# Patient Record
Sex: Female | Born: 1950
Health system: Southern US, Community
[De-identification: ages and names within clinical notes are randomized; demographics above are authoritative.]

## PROBLEM LIST (undated history)

## (undated) DIAGNOSIS — I10 Essential (primary) hypertension: Secondary | ICD-10-CM

## (undated) DIAGNOSIS — R7303 Prediabetes: Secondary | ICD-10-CM

## (undated) DIAGNOSIS — M1990 Unspecified osteoarthritis, unspecified site: Secondary | ICD-10-CM

## (undated) DIAGNOSIS — J45909 Unspecified asthma, uncomplicated: Secondary | ICD-10-CM

## (undated) DIAGNOSIS — K5120 Ulcerative (chronic) proctitis without complications: Secondary | ICD-10-CM

## (undated) DIAGNOSIS — R030 Elevated blood-pressure reading, without diagnosis of hypertension: Secondary | ICD-10-CM

## (undated) HISTORY — DX: Elevated blood-pressure reading, without diagnosis of hypertension: R03.0

## (undated) HISTORY — PX: COLONOSCOPY: SHX174

## (undated) HISTORY — DX: Prediabetes: R73.03

## (undated) HISTORY — PX: CHOLECYSTECTOMY: SHX55

## (undated) HISTORY — PX: ABDOMINAL HYSTERECTOMY: SHX81

---

## 1982-07-31 HISTORY — PX: GALLBLADDER SURGERY: SHX652

## 1982-07-31 HISTORY — PX: COLON SURGERY: SHX602

## 1983-08-01 HISTORY — PX: APPENDECTOMY: SHX54

## 1983-08-01 HISTORY — PX: BREAST EXCISIONAL BIOPSY: SUR124

## 1983-08-01 HISTORY — PX: BREAST BIOPSY: SHX20

## 1983-08-01 HISTORY — PX: VESICOVAGINAL FISTULA CLOSURE W/ TAH: SUR271

## 1993-07-31 HISTORY — PX: BREAST SURGERY: SHX581

## 2007-08-12 DIAGNOSIS — J302 Other seasonal allergic rhinitis: Secondary | ICD-10-CM

## 2007-08-12 DIAGNOSIS — J3089 Other allergic rhinitis: Secondary | ICD-10-CM

## 2007-08-12 DIAGNOSIS — J4520 Mild intermittent asthma, uncomplicated: Secondary | ICD-10-CM | POA: Insufficient documentation

## 2007-08-13 ENCOUNTER — Encounter: Payer: Self-pay | Admitting: Internal Medicine

## 2007-08-27 ENCOUNTER — Ambulatory Visit: Payer: Self-pay | Admitting: Internal Medicine

## 2008-08-03 ENCOUNTER — Telehealth (INDEPENDENT_AMBULATORY_CARE_PROVIDER_SITE_OTHER): Payer: Self-pay | Admitting: *Deleted

## 2008-08-03 ENCOUNTER — Encounter (INDEPENDENT_AMBULATORY_CARE_PROVIDER_SITE_OTHER): Payer: Self-pay | Admitting: *Deleted

## 2008-08-12 ENCOUNTER — Encounter (INDEPENDENT_AMBULATORY_CARE_PROVIDER_SITE_OTHER): Payer: Self-pay | Admitting: *Deleted

## 2008-08-24 ENCOUNTER — Ambulatory Visit: Payer: Self-pay | Admitting: Internal Medicine

## 2010-04-11 ENCOUNTER — Ambulatory Visit: Payer: Self-pay | Admitting: Internal Medicine

## 2010-08-30 NOTE — Assessment & Plan Note (Signed)
Summary: rov/ mbw   Primary Provider/Referring Provider:  [place]  CC:  Follow up visit-asthma and allergies; recent chest cold-used Mucinex and helped.Marland Kitchen  History of Present Illness: History of Present Illness: Current Problems:  ALLERGIC RHINITIS (ICD-477.9) ASTHMA (ICD-493.90)  09/04/2007-  [place] [facility] patient with seasonal Spring and Fall complaints, mostly just chest tight and wheeze. Never needed ER. No maintenance inhaler. has an HFA inhaler that clogs up. Typically uses rescue inhaler 1-2 times/day for several days, then weeks or months with no need for med. No WASO. Proair. Never needed maintenance therapy. Never skin tested or on shots.  08/24/08- Asthma, rhinitis Last month called [place] for Rx for acute infection, but that resolved. No flu shot- delclines H1N1. Proair has been sufficient. Usually only needs rescue inhaler during extremes of temperature- usually not needed at all. Charting updated.  April 11, 2010- Asthma, rhinitis 60 yo never smoker coming for 1 year f/u. Getting over a cold- took Mucinex. Has been using her rescue inhaler more with this. Not bad, but aware of the wheeze. Cough productive, sputum is turning white again. No sinus trouble. Overall health good.     Asthma History    Initial Asthma Severity Rating:    Age range: 12+ years    Symptoms: 0-2 days/week    Nighttime Awakenings: 0-2/month    Interferes w/ normal activity: no limitations    SABA use (not for EIB): 0-2 days/week    Asthma Severity Assessment: Intermittent    Preventive Screening-Counseling & Management  Alcohol-Tobacco     Smoking Status: never  Current Medications (verified): 1)  Ventolin Hfa 108 (90 Base) Mcg/act Aers (Albuterol Sulfate) .... 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  Not married  Review of Systems      See HPI       The patient complains of productive cough and change in color of mucus.  The patient denies shortness of breath with activity, shortness of breath at rest, non-productive cough, coughing up blood, chest pain, irregular heartbeats, acid heartburn, indigestion, weight change, abdominal pain, difficulty swallowing, sore throat, tooth/dental problems, headaches, nasal congestion/difficulty breathing through nose, sneezing, itching, ear ache, hand/feet swelling, rash, and fever.    Vital Signs:  Patient profile:   60 year old female Weight:      134.13 pounds O2 Sat:      98 % on Room air Pulse rate:   74 / minute BP sitting:   142 / 88  (left arm) Cuff size:   regular  Vitals Entered By: Reynaldo Minium CMA (April 11, 2010 4:48 PM)  O2 Flow:  Room air CC: Follow up visit-asthma and allergies; recent chest cold-used Mucinex and helped.   Physical Exam  Additional Exam:  General: A/Ox3; pleasant and cooperative, NAD, trim SKIN: no rash, lesions NODES: no lymphadenopathy HEENT: [place]/AT, EOM- WNL, Conjuctivae- clear, PERRLA, TM-WNL, Nose- clear, Throat-red/ no exudate or drainage, voice normal  NECK: Supple w/ fair ROM, JVD- none, normal carotid impulses w/o bruits Thyroid- normal to palpation CHEST: Clear to P&A, quiet  chest, no cough or wheeze HEART: RRR, no m/g/r heard ABDOMEN: Soft and nl; nml bowel sounds; no organomegaly or masses  noted, trim VQQ:VZDG, nl pulses, no edema  NEURO: Grossly intact to observation      Impression & Recommendations:  Problem # 1:  ALLERGIC RHINITIS (ICD-477.9)  Minor and controlled now.  Problem # 2:  ASTHMA (ICD-493.90) Recent acute exacerbation related to a viral pattern and now resolving with normal course. We need to refill her rescue inhaler but I don't think she will need more than that. Fluids and rest advised. Flu vax deferred till feeling better.  Other Orders: Est. Patient Level III (38756)  Patient Instructions: 1)  Please schedule a follow-up appointment in 1 year. 2)  Refill script for Ventolin rescue inhaler 3)  Continue fluids rest and comfort measures. If this viral pattern flare doesn't go on and clear, let me know. Prescriptions: VENTOLIN HFA 108 (90 BASE) MCG/ACT AERS (ALBUTEROL SULFATE) 2 puffs four times a day as needed  #1 x prn   Entered and Authorized by:   Waymon Budge MD   Signed by:   Waymon Budge MD on 04/11/2010   Method used:   Print then Give to Patient   RxID:   [id number]

## 2010-12-06 ENCOUNTER — Emergency Department (HOSPITAL_COMMUNITY)
Admission: EM | Admit: 2010-12-06 | Discharge: 2010-12-07 | Disposition: A | Discharge status: Home / Self Care | Payer: 59 | Attending: Emergency Medicine | Admitting: Emergency Medicine

## 2010-12-07 ENCOUNTER — Emergency Department (HOSPITAL_COMMUNITY)
Admission: EM | Admit: 2010-12-07 | Discharge: 2010-12-07 | Disposition: A | Discharge status: Home / Self Care | Payer: 59 | Attending: Emergency Medicine | Admitting: Emergency Medicine

## 2010-12-07 DIAGNOSIS — IMO0001 Reserved for inherently not codable concepts without codable children: Secondary | ICD-10-CM | POA: Insufficient documentation

## 2010-12-07 DIAGNOSIS — S61209A Unspecified open wound of unspecified finger without damage to nail, initial encounter: Secondary | ICD-10-CM | POA: Insufficient documentation

## 2010-12-07 DIAGNOSIS — Z203 Contact with and (suspected) exposure to rabies: Secondary | ICD-10-CM | POA: Insufficient documentation

## 2010-12-07 DIAGNOSIS — J45909 Unspecified asthma, uncomplicated: Secondary | ICD-10-CM | POA: Insufficient documentation

## 2010-12-07 DIAGNOSIS — Z23 Encounter for immunization: Secondary | ICD-10-CM | POA: Insufficient documentation

## 2010-12-11 ENCOUNTER — Inpatient Hospital Stay (INDEPENDENT_AMBULATORY_CARE_PROVIDER_SITE_OTHER)
Admission: RE | Admit: 2010-12-11 | Discharge: 2010-12-11 | Disposition: A | Discharge status: Home / Self Care | Payer: 59 | Source: Ambulatory Visit | Attending: Family Medicine | Admitting: Family Medicine

## 2010-12-11 DIAGNOSIS — Z23 Encounter for immunization: Secondary | ICD-10-CM

## 2010-12-15 ENCOUNTER — Inpatient Hospital Stay (INDEPENDENT_AMBULATORY_CARE_PROVIDER_SITE_OTHER)
Admission: RE | Admit: 2010-12-15 | Discharge: 2010-12-15 | Disposition: A | Discharge status: Home / Self Care | Payer: 59 | Source: Ambulatory Visit | Attending: Family Medicine | Admitting: Family Medicine

## 2010-12-15 DIAGNOSIS — Z23 Encounter for immunization: Secondary | ICD-10-CM

## 2010-12-21 ENCOUNTER — Inpatient Hospital Stay (INDEPENDENT_AMBULATORY_CARE_PROVIDER_SITE_OTHER)
Admission: RE | Admit: 2010-12-21 | Discharge: 2010-12-21 | Disposition: A | Discharge status: Home / Self Care | Payer: 59 | Source: Ambulatory Visit

## 2010-12-21 DIAGNOSIS — Z23 Encounter for immunization: Secondary | ICD-10-CM

## 2011-06-05 ENCOUNTER — Other Ambulatory Visit: Payer: Self-pay | Admitting: Internal Medicine

## 2012-03-31 ENCOUNTER — Encounter (HOSPITAL_COMMUNITY): Payer: Self-pay | Admitting: *Deleted

## 2012-03-31 ENCOUNTER — Emergency Department (HOSPITAL_COMMUNITY)
Admission: EM | Admit: 2012-03-31 | Discharge: 2012-03-31 | Disposition: A | Discharge status: Home / Self Care | Payer: 59 | Attending: Emergency Medicine | Admitting: Emergency Medicine

## 2012-03-31 DIAGNOSIS — W540XXA Bitten by dog, initial encounter: Secondary | ICD-10-CM | POA: Insufficient documentation

## 2012-03-31 DIAGNOSIS — S51809A Unspecified open wound of unspecified forearm, initial encounter: Secondary | ICD-10-CM | POA: Insufficient documentation

## 2012-03-31 DIAGNOSIS — Y9389 Activity, other specified: Secondary | ICD-10-CM | POA: Insufficient documentation

## 2012-03-31 DIAGNOSIS — Y998 Other external cause status: Secondary | ICD-10-CM | POA: Insufficient documentation

## 2012-03-31 DIAGNOSIS — J45909 Unspecified asthma, uncomplicated: Secondary | ICD-10-CM | POA: Insufficient documentation

## 2012-03-31 HISTORY — DX: Unspecified asthma, uncomplicated: J45.909

## 2012-03-31 MED ORDER — AMOXICILLIN-POT CLAVULANATE 875-125 MG PO TABS: 1.0000 | ORAL_TABLET | Freq: Two times a day (BID) | ORAL | Status: AC

## 2012-03-31 NOTE — ED Notes (Signed)
Pt bite by family dog, dog updated on shots and vaccine. Pt has two lacerations to right wrist. One on the posterior wrist that is like skin tear and the other a puncture on the anterior wrist. Pt able to wiggle fingers, sensation intact and cap refill less than 3.

## 2012-03-31 NOTE — ED Provider Notes (Signed)
History     CSN: [id number]  Arrival date & time 03/31/12  0865   First MD Initiated Contact with Patient 03/31/12 2108      Chief Complaint  Patient presents with  . Animal Bite   HPI  She provided by the patient. Patient is a 61 year old female with history of asthma who presents with injuries from dog bite. Patient states that she was taking between 2 of her dogs while they're fighting. She reports she has a Research scientist (medical) and terrier. While trying to pull them apart the terrier bit her on her right forearm and wrist area. Dogs are current on all shots.  Pt has had recent tetanus within past 5 years.  Pt sustained lacerations with bleeding.  Bleeding was controlled.  She denies any numbness or weakness in hand or wirst.  No decreased ROM.      Past Medical History  Diagnosis Date  . Asthma     History reviewed. No pertinent past surgical history.  History reviewed. No pertinent family history.  History  Substance Use Topics  . Smoking status: Never Smoker   . Smokeless tobacco: Not on file  . Alcohol Use: No    OB History    Grav Para Term Preterm Abortions TAB SAB Ect Mult Living                  Review of Systems  Skin:       Dog bite to rt wrist  Neurological: Negative for weakness and numbness.    Allergies  Review of patient's allergies indicates no known allergies.  Home Medications   Current Outpatient Rx  Name Route Sig Dispense Refill  . ALBUTEROL SULFATE HFA 108 (90 BASE) MCG/ACT IN AERS Inhalation Inhale 2 puffs into the lungs every 6 (six) hours as needed. For shortness of breath      BP 177/79  Pulse 93  Temp 97.6 F (36.4 C) (Oral)  Resp 16  SpO2 100%  Physical Exam  Nursing note and vitals reviewed. Constitutional: She is oriented to person, place, and time. She appears well-developed and well-nourished. No distress.  HENT:  Head: Normocephalic.  Cardiovascular: Normal rate and regular rhythm.   Pulmonary/Chest: Effort normal and  breath sounds normal.  Abdominal: Soft.  Musculoskeletal:       Arms:      Irregular laceration to rt wrist and forearm area.  Normal ROM in wrist and fingers.  Normal grip strength.  Normal sensations in fingers and normal cap refill < 2 sec.  No deep structure or tendon involvement during exploration and through full ROM.   Neurological: She is alert and oriented to person, place, and time.  Skin: Skin is warm and dry.  Psychiatric: She has a normal mood and affect. Her behavior is normal.    ED Course  Procedures   LACERATION REPAIR Performed by: Angus Seller Authorized by: Angus Seller Consent: Verbal consent obtained. Risks and benefits: risks, benefits and alternatives were discussed Consent given by: patient Patient identity confirmed: provided demographic data Prepped and Draped in normal sterile fashion Wound explored  Laceration Location: Right forearm  Laceration Length: 4cm  No Foreign Bodies seen or palpated  Anesthesia: local infiltration  Local anesthetic: lidocaine 2% with epinephrine  Anesthetic total: 4 ml  Irrigation method: syringe Amount of cleaning: Extensive  Skin closure: Loose closure with 3-0 Prolene, and steri-strips.  Number of sutures: 1  Technique: Simple interrupted.   Patient tolerance: Patient tolerated the procedure well with  no immediate complications.   1. Dog bite of forearm       MDM  Patient seen and evaluated. Wounds were heavily irrigated and loosely closed. 1 stitch to pull the wound loosely together Steri-Strips also applied       Angus Seller, [place] 04/01/12 [id number]

## 2012-03-31 NOTE — ED Notes (Signed)
Pt has 2 lacs to right wrist. Bleeding controlled. Cap refill <3sec, sensation continues to be intact.

## 2012-03-31 NOTE — ED Notes (Signed)
Pt d/c home in NAD. Pt voiced understanding of d/c instructions and follow up care.  

## 2012-04-01 NOTE — ED Provider Notes (Signed)
Medical screening examination/treatment/procedure(s) were performed by non-physician practitioner and as supervising physician I was immediately available for consultation/collaboration.   Joya Gaskins, MD 04/01/12 2351

## 2012-08-26 ENCOUNTER — Telehealth: Payer: Self-pay | Admitting: Internal Medicine

## 2012-08-26 MED ORDER — ALBUTEROL SULFATE HFA 108 (90 BASE) MCG/ACT IN AERS: 2.0000 | INHALATION_SPRAY | Freq: Four times a day (QID) | RESPIRATORY_TRACT | Status: DC | PRN

## 2012-08-26 NOTE — Telephone Encounter (Signed)
Called and spoke with pt She states that she is needing ventolin refilled She was last seen in 2011 I have scheduled her to see CDY to re establish on 09/17/12 Rx refilled x 1 only

## 2012-08-27 ENCOUNTER — Other Ambulatory Visit: Payer: Self-pay | Admitting: Internal Medicine

## 2012-09-17 ENCOUNTER — Encounter: Payer: Self-pay | Admitting: Internal Medicine

## 2012-09-17 ENCOUNTER — Ambulatory Visit (INDEPENDENT_AMBULATORY_CARE_PROVIDER_SITE_OTHER): Payer: 59 | Admitting: Internal Medicine

## 2012-09-17 ENCOUNTER — Institutional Professional Consult (permissible substitution): Payer: 59 | Admitting: Internal Medicine

## 2012-09-17 VITALS — BP 158/68 | HR 82 | Ht 64.0 in | Wt 138.8 lb

## 2012-09-17 DIAGNOSIS — J309 Allergic rhinitis, unspecified: Secondary | ICD-10-CM

## 2012-09-17 DIAGNOSIS — J4520 Mild intermittent asthma, uncomplicated: Secondary | ICD-10-CM

## 2012-09-17 DIAGNOSIS — J45909 Unspecified asthma, uncomplicated: Secondary | ICD-10-CM

## 2012-09-17 MED ORDER — ALBUTEROL SULFATE HFA 108 (90 BASE) MCG/ACT IN AERS: 2.0000 | INHALATION_SPRAY | Freq: Four times a day (QID) | RESPIRATORY_TRACT | Status: DC | PRN

## 2012-09-17 NOTE — Assessment & Plan Note (Signed)
Her triggers are nonspecific temperature irritants, seasonal changes and sent her. From her description, a rescue inhaler for occasional use as appropriate. I don't hear her describing need for a maintenance controller at this time

## 2012-09-17 NOTE — Progress Notes (Signed)
09/17/12- 61 yoF never smoker-Former patient-last seen 2011; asthma and allergies She wishes to establish right now, doing well and mainly just needing access to medication refills LOV 04/11/10. Never allergy skin tested. She has not needed her maintenance controller, just occasional rescue inhaler. Triggers include hot and cold weather extremes, more important than pollen. She is living in a house with no basement, central air conditioning, wall-to-wall carpet, no mold and no smokers. She works in a terrible job with no important exposure recognized.  Prior to Admission medications   Medication Sig Start Date End Date Taking? Authorizing Provider  albuterol (PROVENTIL HFA;VENTOLIN HFA) 108 (90 BASE) MCG/ACT inhaler Inhale 2 puffs into the lungs every 6 (six) hours as needed for wheezing or shortness of breath. For shortness of breath 09/17/12 09/17/13 Yes Waymon Budge, MD  alendronate (FOSAMAX) 70 MG tablet Take 70 mg by mouth every 7 (seven) days. Take with a full glass of water on an empty stomach.   Yes Historical Provider, MD  calcium citrate-vitamin D (CITRACAL+D) 315-200 MG-UNIT per tablet Take 1 tablet by mouth daily.   Yes Historical Provider, MD  ergocalciferol (VITAMIN D2) 50000 UNITS capsule Take 50,000 Units by mouth once a week.   Yes Historical Provider, MD   Past Medical History  Diagnosis Date  . Asthma    Past Surgical History  Procedure Laterality Date  . Gallbladder surgery  1984  . Colon surgery  1984  . Vesicovaginal fistula closure w/ tah  1985  . Breast surgery  1995  . Appendectomy  1985   Family History  Problem Relation Age of Onset  . Cancer Father     abdominal   History   Social History  . Marital Status: Legally Separated    Spouse Name: N/A    Number of Children: 1  . Years of Education: N/A   Occupational History  . clerical    Social History Main Topics  . Smoking status: Never Smoker   . Smokeless tobacco: Not on file  . Alcohol Use: No   . Drug Use: No  . Sexually Active:    Other Topics Concern  . Not on file   Social History Narrative  . No narrative on file   ROS-see HPI Constitutional:   No-   weight loss, night sweats, fevers, chills, fatigue, lassitude. HEENT:   No-  headaches, difficulty swallowing, tooth/dental problems, sore throat,       No-  sneezing, itching, ear ache, nasal congestion, post nasal drip,  CV:  No-   chest pain, orthopnea, PND, swelling in lower extremities, anasarca,                                  dizziness, palpitations Resp: No-   shortness of breath with exertion or at rest.              No-   productive cough,  No non-productive cough,  No- coughing up of blood.              No-   change in color of mucus.  +Occasional wheezing.   Skin: No-   rash or lesions. GI:  No-   heartburn, indigestion, abdominal pain, nausea, vomiting, diarrhea,                 change in bowel habits, loss of appetite GU: No-   dysuria, change in color of urine, no urgency  or frequency.  No- flank pain. MS:  No-   joint pain or swelling.  No- decreased range of motion.  No- back pain. Neuro-     nothing unusual Psych:  No- change in mood or affect. No depression or anxiety.  No memory loss.  OBJ- Physical Exam General- Alert, Oriented, Affect-appropriate, Distress- none acute Skin- rash-none, lesions- none, excoriation- none Lymphadenopathy- none Head- atraumatic            Eyes- Gross vision intact, PERRLA, conjunctivae and secretions clear            Ears- Hearing, canals-normal            Nose- Clear, no-Septal dev, mucus, polyps, erosion, perforation             Throat- Mallampati II-III , mucosa clear , drainage- none, tonsils- atrophic Neck- flexible , trachea midline, no stridor , thyroid nl, carotid no bruit Chest - symmetrical excursion , unlabored           Heart/CV- RRR , no murmur , no gallop  , no rub, nl s1 s2                           - JVD- none , edema- none, stasis changes- none,  varices- none           Lung- clear to P&A, wheeze- none, cough- none , dullness-none, rub- none           Chest wall-  Abd- tender-no, distended-no, bowel sounds-present, HSM- no Br/ Gen/ Rectal- Not done, not indicated Extrem- cyanosis- none, clubbing, none, atrophy- none, strength- nl Neuro- grossly intact to observation

## 2012-09-17 NOTE — Patient Instructions (Addendum)
Script is active in computer to refill your rescue inhaler- Please call for refill as needed  Let us know if we can help.

## 2012-09-17 NOTE — Assessment & Plan Note (Signed)
We will watch to see if she needs more than just an over-the-counter antihistamine

## 2012-11-03 ENCOUNTER — Other Ambulatory Visit: Payer: Self-pay | Admitting: Internal Medicine

## 2013-09-17 ENCOUNTER — Ambulatory Visit: Payer: Self-pay | Admitting: Internal Medicine

## 2013-12-16 ENCOUNTER — Other Ambulatory Visit: Payer: Self-pay | Admitting: Internal Medicine

## 2013-12-17 NOTE — Telephone Encounter (Signed)
Refill request for Ventolin HFA.  Last ov w/ CDY was 08/2012 with recs to follow up in 1 year.  1 additional refill given with note to pharmacy that pt is overdue for appt.

## 2014-08-13 ENCOUNTER — Encounter: Payer: Self-pay | Admitting: Internal Medicine

## 2014-08-13 ENCOUNTER — Ambulatory Visit (INDEPENDENT_AMBULATORY_CARE_PROVIDER_SITE_OTHER): Payer: BLUE CROSS/BLUE SHIELD | Admitting: Internal Medicine

## 2014-08-13 VITALS — BP 124/80 | HR 94 | Ht 64.0 in | Wt 159.4 lb

## 2014-08-13 DIAGNOSIS — J4520 Mild intermittent asthma, uncomplicated: Secondary | ICD-10-CM

## 2014-08-13 DIAGNOSIS — J45901 Unspecified asthma with (acute) exacerbation: Secondary | ICD-10-CM

## 2014-08-13 MED ORDER — AZITHROMYCIN 250 MG PO TABS: ORAL_TABLET | ORAL | Status: DC

## 2014-08-13 MED ORDER — LEVALBUTEROL HCL 0.63 MG/3ML IN NEBU
0.6300 mg | INHALATION_SOLUTION | Freq: Once | RESPIRATORY_TRACT | Status: AC
  Administered 2014-08-13: 0.63 mg via RESPIRATORY_TRACT

## 2014-08-13 MED ORDER — ALBUTEROL SULFATE HFA 108 (90 BASE) MCG/ACT IN AERS: INHALATION_SPRAY | RESPIRATORY_TRACT | Status: DC

## 2014-08-13 MED ORDER — METHYLPREDNISOLONE ACETATE 80 MG/ML IJ SUSP
80.0000 mg | Freq: Once | INTRAMUSCULAR | Status: AC
  Administered 2014-08-13: 80 mg via INTRAMUSCULAR

## 2014-08-13 NOTE — Patient Instructions (Signed)
Script sent for Z pak  And for albuterol rescue inhaler  Neb xop 0.63  Depo 80  Please call if you don't feel you are doing ok in a few days  Call when you are feeling better, to come by and get a flu shot

## 2014-08-13 NOTE — Progress Notes (Signed)
09/17/12- 61 yoF never smoker-Former patient-last seen 2011; asthma and allergies She wishes to establish right now, doing well and mainly just needing access to medication refills LOV 04/11/10. Never allergy skin tested. She has not needed her maintenance controller, just occasional rescue inhaler. Triggers include hot and cold weather extremes, more important than pollen. She is living in a house with no basement, central air conditioning, wall-to-wall carpet, no mold and no smokers. She works in a terrible job with no important exposure recognized.  08/13/14- 89 yoF never smoker-Former patient-last seen 2011; asthma and allergies FOLLOWS ONG:EXBMW congestion-has not been treated for this. Wheezing and SOB as well. Had not gotten around to flu vaccine but we decided to defer it for this visit because she is acutely ill with a cold caught from her children. No definite fever. Sick now about 1 week with sneeze, cough, head and chest congestion, purulent sputum  ROS-see HPI Constitutional:   No-   weight loss, night sweats, fevers, chills, fatigue, lassitude. HEENT:   No-  headaches, difficulty swallowing, tooth/dental problems, sore throat,       No-  sneezing, itching, ear ache, nasal congestion, post nasal drip,  CV:  No-   chest pain, orthopnea, PND, swelling in lower extremities, anasarca,                                  dizziness, palpitations Resp: No-   shortness of breath with exertion or at rest.              No-   productive cough,  No non-productive cough,  No- coughing up of blood.              No-   change in color of mucus.  +Occasional wheezing.   Skin: No-   rash or lesions. GI:  No-   heartburn, indigestion, abdominal pain, nausea, vomiting,  GU: . MS:  No-   joint pain or swelling. Neuro-     nothing unusual Psych:  No- change in mood or affect. No depression or anxiety.  No memory loss.  OBJ- Physical Exam General- Alert, Oriented, Affect-appropriate, Distress- none  acute Skin- rash-none, lesions- none, excoriation- none Lymphadenopathy- none Head- atraumatic            Eyes- Gross vision intact, PERRLA, conjunctivae and secretions clear            Ears- Hearing, canals-normal            Nose- + turbinate edema, no-Septal dev, mucus, polyps, erosion, perforation             Throat- Mallampati II-III , mucosa clear , drainage- none, tonsils- atrophic Neck- flexible , trachea midline, no stridor , thyroid nl, carotid no bruit Chest - symmetrical excursion , unlabored           Heart/CV- RRR , no murmur , no gallop  , no rub, nl s1 s2                           - JVD- none , edema- none, stasis changes- none, varices- none           Lung- clear to P&A, wheeze+ bilateral unlabored, cough- none , dullness-none, rub- none           Chest wall-  Abd-  Br/ Gen/ Rectal- Not done, not indicated Extrem- cyanosis- none, clubbing,  none, atrophy- none, strength- nl Neuro- grossly intact to observation

## 2014-08-16 NOTE — Assessment & Plan Note (Signed)
Acute asthmatic bronchitis with upper respiratory infection and purulent sputum Plan Z-Pak, nebulizer Xopenex, Depo-Medrol

## 2015-01-04 ENCOUNTER — Telehealth: Payer: Self-pay | Admitting: Internal Medicine

## 2015-01-04 NOTE — Telephone Encounter (Signed)
Spoke with pt - c/o cough with yellow mucus x 3 wks, wheezing at times, and SOB when coughing "really hard."  No chest tightness, CP, or f/c/s.  Using albuterol hfa with relief.  Has tried mucinex without relief.  Requesting OV this week - scheduled to see CY on Friday, June 10 at [facility].  Pt confirmed appt and voiced no further questions or concerns at this time.

## 2015-01-08 ENCOUNTER — Ambulatory Visit (INDEPENDENT_AMBULATORY_CARE_PROVIDER_SITE_OTHER)
Admission: RE | Admit: 2015-01-08 | Discharge: 2015-01-08 | Disposition: A | Discharge status: Home / Self Care | Payer: Medicare Other | Source: Ambulatory Visit | Attending: Internal Medicine | Admitting: Internal Medicine

## 2015-01-08 ENCOUNTER — Encounter: Payer: Self-pay | Admitting: Internal Medicine

## 2015-01-08 ENCOUNTER — Encounter (INDEPENDENT_AMBULATORY_CARE_PROVIDER_SITE_OTHER): Payer: Self-pay

## 2015-01-08 ENCOUNTER — Ambulatory Visit (INDEPENDENT_AMBULATORY_CARE_PROVIDER_SITE_OTHER): Payer: Medicare Other | Admitting: Internal Medicine

## 2015-01-08 ENCOUNTER — Other Ambulatory Visit (INDEPENDENT_AMBULATORY_CARE_PROVIDER_SITE_OTHER): Payer: Medicare Other

## 2015-01-08 VITALS — BP 130/86 | HR 82 | Ht 64.0 in | Wt 154.0 lb

## 2015-01-08 DIAGNOSIS — J309 Allergic rhinitis, unspecified: Secondary | ICD-10-CM

## 2015-01-08 DIAGNOSIS — J302 Other seasonal allergic rhinitis: Secondary | ICD-10-CM

## 2015-01-08 DIAGNOSIS — J45901 Unspecified asthma with (acute) exacerbation: Secondary | ICD-10-CM | POA: Diagnosis not present

## 2015-01-08 DIAGNOSIS — R05 Cough: Secondary | ICD-10-CM | POA: Diagnosis not present

## 2015-01-08 DIAGNOSIS — J3089 Other allergic rhinitis: Secondary | ICD-10-CM

## 2015-01-08 LAB — CBC WITH DIFFERENTIAL/PLATELET
Basophils Absolute: 0 10*3/uL (ref 0.0–0.1)
Basophils Relative: 0.3 % (ref 0.0–3.0)
Eosinophils Absolute: 0.1 10*3/uL (ref 0.0–0.7)
Eosinophils Relative: 1.6 % (ref 0.0–5.0)
HCT: 43.5 % (ref 36.0–46.0)
Hemoglobin: 14.3 g/dL (ref 12.0–15.0)
LYMPHS PCT: 23.9 % (ref 12.0–46.0)
Lymphs Abs: 1.8 10*3/uL (ref 0.7–4.0)
MCHC: 32.9 g/dL (ref 30.0–36.0)
MCV: 87.8 fl (ref 78.0–100.0)
Monocytes Absolute: 0.6 10*3/uL (ref 0.1–1.0)
Monocytes Relative: 8.1 % (ref 3.0–12.0)
NEUTROS ABS: 4.9 10*3/uL (ref 1.4–7.7)
NEUTROS PCT: 66.1 % (ref 43.0–77.0)
Platelets: 226 10*3/uL (ref 150.0–400.0)
RBC: 4.95 Mil/uL (ref 3.87–5.11)
RDW: 13 % (ref 11.5–15.5)
WBC: 7.5 10*3/uL (ref 4.0–10.5)

## 2015-01-08 MED ORDER — LEVALBUTEROL HCL 0.63 MG/3ML IN NEBU: 0.6300 mg | INHALATION_SOLUTION | Freq: Once | RESPIRATORY_TRACT | Status: DC

## 2015-01-08 MED ORDER — DOXYCYCLINE HYCLATE 100 MG PO TABS: ORAL_TABLET | ORAL | Status: DC

## 2015-01-08 MED ORDER — FLUTICASONE FUROATE-VILANTEROL 100-25 MCG/INH IN AEPB
1.0000 | INHALATION_SPRAY | Freq: Every day | RESPIRATORY_TRACT | Status: AC
Start: 2015-01-08 — End: 2015-01-09

## 2015-01-08 MED ORDER — METHYLPREDNISOLONE ACETATE 80 MG/ML IJ SUSP
80.0000 mg | Freq: Once | INTRAMUSCULAR | Status: AC
  Administered 2015-01-08: 80 mg via INTRAMUSCULAR

## 2015-01-08 NOTE — Progress Notes (Signed)
09/17/12- 61 yoF never smoker-Former patient-last seen 2011; asthma and allergies She wishes to establish right now, doing well and mainly just needing access to medication refills LOV 04/11/10. Never allergy skin tested. She has not needed her maintenance controller, just occasional rescue inhaler. Triggers include hot and cold weather extremes, more important than pollen. She is living in a house with no basement, central air conditioning, wall-to-wall carpet, no mold and no smokers. She works in a terrible job with no important exposure recognized.  08/13/14- 51 yoF never smoker-Former patient-last seen 2011; asthma and allergies FOLLOWS OEV:OJJKK congestion-has not been treated for this. Wheezing and SOB as well. Had not gotten around to flu vaccine but we decided to defer it for this visit because she is acutely ill with a cold caught from her children. No definite fever. Sick now about 1 week with sneeze, cough, head and chest congestion, purulent sputum  01/08/15-  56 yoF never smoker-followed for asthma/ bronchitis and allergies  Reports: prod. cough yellow mucus, wheezing. Denies SOB Acute illness- 4 weeks ago onset throat tickle then chest cough, yellow sputum. Clear mucus from nose but not much head congestion. No fever, chills or sore throat No antibiotic.  ROS-see HPI Constitutional:   No-   weight loss, night sweats, fevers, chills, fatigue, lassitude. HEENT:   No-  headaches, difficulty swallowing, tooth/dental problems, sore throat,       No-  sneezing, itching, ear ache, +nasal congestion, post nasal drip,  CV:  No-   chest pain, orthopnea, PND, swelling in lower extremities, anasarca,                                  dizziness, palpitations Resp: No-   shortness of breath with exertion or at rest.               + productive cough,  No non-productive cough,  No- coughing up of blood.              +change in color of mucus.  +Occasional wheezing.   Skin: No-   rash or  lesions. GI:  No-   heartburn, indigestion, abdominal pain, nausea, vomiting,  GU: . MS:  No-   joint pain or swelling. Neuro-     nothing unusual Psych:  No- change in mood or affect. No depression or anxiety.  No memory loss.  OBJ- Physical Exam General- Alert, Oriented, Affect-appropriate, Distress- none acute Skin- rash-none, lesions- none, excoriation- none Lymphadenopathy- none Head- atraumatic            Eyes- Gross vision intact, PERRLA, conjunctivae and secretions clear            Ears- Hearing, canals-normal            Nose- + turbinate edema, no-Septal dev, mucus, polyps, erosion, perforation             Throat- Mallampati II-III , mucosa clear , drainage- none, tonsils- atrophic Neck- flexible , trachea midline, no stridor , thyroid nl, carotid no bruit Chest - symmetrical excursion , unlabored           Heart/CV- RRR , no murmur , no gallop  , no rub, nl s1 s2                           - JVD- none , edema- none, stasis changes- none, varices- none  Lung- clear to P&A, wheeze+ bilateral unlabored, cough+deep , dullness-none, rub- none           Chest wall-  Abd-  Br/ Gen/ Rectal- Not done, not indicated Extrem- cyanosis- none, clubbing, none, atrophy- none, strength- nl Neuro- grossly intact to observation

## 2015-01-08 NOTE — Assessment & Plan Note (Signed)
Sustained wheezy bronchitis. Not clear if viral or allergic initially. This has persisted longer than expected without obvious trigger Plan- CXR, CBC, Allergy profile, neb xopenex, depomedrol, sample Breo

## 2015-01-08 NOTE — Addendum Note (Signed)
Addended by: Patrcia Dolly on: 01/08/2015 11:59 AM   Modules accepted: Orders

## 2015-01-08 NOTE — Addendum Note (Signed)
Addended by: Clayborne Dana C on: 01/08/2015 10:20 AM   Modules accepted: Orders

## 2015-01-08 NOTE — Assessment & Plan Note (Signed)
Current rhinorhea is more likely part of her acute illness

## 2015-01-08 NOTE — Addendum Note (Signed)
Addended by: Patrcia Dolly on: 01/08/2015 10:00 AM   Modules accepted: Orders

## 2015-01-08 NOTE — Addendum Note (Signed)
Addended by: Patrcia Dolly on: 01/08/2015 10:20 AM   Modules accepted: Orders

## 2015-01-08 NOTE — Patient Instructions (Signed)
Script for doxycycline sent  Neb xop 0.63  Depo 80  Order- CXR   Dx asthmatic bronchitis  Order- lab- CBC w diff, Allergy profile  Sample Breo 100 Ellipta    1 puff then rinse mouth once daily  Please call as needed

## 2015-01-11 LAB — ALLERGY FULL PROFILE
Allergen, D pternoyssinus,d7: 7.02 kU/L — ABNORMAL HIGH
Allergen,Goose feathers, e70: <0.1 kU/L
Bahia Grass: <0.1 kU/L
CAT DANDER: 0.13 kU/L — AB
Common Ragweed: <0.1 kU/L
Curvularia lunata: <0.1 kU/L
D. FARINAE: 6.56 kU/L — AB
DOG DANDER: 0.17 kU/L — AB
Fescue: <0.1 kU/L
G005 Rye, Perennial: <0.1 kU/L
House Dust Hollister: 0.32 kU/L — ABNORMAL HIGH
IgE (Immunoglobulin E), Serum: 38 kU/L (ref <115)
Lamb's Quarters: <0.1 kU/L
Oak: <0.1 kU/L
Plantain: <0.1 kU/L
Stemphylium Botryosum: <0.1 kU/L
Timothy Grass: <0.1 kU/L

## 2015-01-12 ENCOUNTER — Telehealth: Payer: Self-pay | Admitting: Internal Medicine

## 2015-01-12 NOTE — Telephone Encounter (Signed)
Notes Recorded by Len Blalock, CMA on 01/11/2015 at 4:16 PM lmtcb X1 for pt. ------  Notes Recorded by Deneise Lever, MD on 01/08/2015 at 1:20 PM CXR clear- no pneumonia or active process   Notes Recorded by Deneise Lever, MD on 01/11/2015 at 9:05 PM Allergy profile- allergy antibodies are elevated for dust mites, cat and dog. Would emphasize control of house dust. Keep pets out of bedroom --------- Pt is aware of results. Nothing further was needed.

## 2015-04-09 ENCOUNTER — Ambulatory Visit (INDEPENDENT_AMBULATORY_CARE_PROVIDER_SITE_OTHER): Payer: Medicare Other | Admitting: Internal Medicine

## 2015-04-09 ENCOUNTER — Encounter: Payer: Self-pay | Admitting: Internal Medicine

## 2015-04-09 VITALS — BP 160/76 | HR 84 | Ht 64.0 in | Wt 152.4 lb

## 2015-04-09 DIAGNOSIS — J4520 Mild intermittent asthma, uncomplicated: Secondary | ICD-10-CM | POA: Diagnosis not present

## 2015-04-09 DIAGNOSIS — J309 Allergic rhinitis, unspecified: Secondary | ICD-10-CM

## 2015-04-09 DIAGNOSIS — J302 Other seasonal allergic rhinitis: Secondary | ICD-10-CM

## 2015-04-09 DIAGNOSIS — J3089 Other allergic rhinitis: Secondary | ICD-10-CM

## 2015-04-09 DIAGNOSIS — Z23 Encounter for immunization: Secondary | ICD-10-CM

## 2015-04-09 NOTE — Patient Instructions (Signed)
Flu vax  Please call as needed  

## 2015-04-09 NOTE — Progress Notes (Signed)
09/17/12- 61 yoF never smoker-Former patient-last seen 2011; asthma and allergies She wishes to establish right now, doing well and mainly just needing access to medication refills LOV 04/11/10. Never allergy skin tested. She has not needed her maintenance controller, just occasional rescue inhaler. Triggers include hot and cold weather extremes, more important than pollen. She is living in a house with no basement, central air conditioning, wall-to-wall carpet, no mold and no smokers. She works in a terrible job with no important exposure recognized.  08/13/14- 30 yoF never smoker-Former patient-last seen 2011; asthma and allergies FOLLOWS [id number] congestion-has not been treated for this. Wheezing and SOB as well. Had not gotten around to flu vaccine but we decided to defer it for this visit because she is acutely ill with a cold caught from her children. No definite fever. Sick now about 1 week with sneeze, cough, head and chest congestion, purulent sputum  01/08/15-  25 yoF never smoker-followed for asthma/ bronchitis and allergies  Reports: prod. cough yellow mucus, wheezing. Denies SOB Acute illness- 4 weeks ago onset throat tickle then chest cough, yellow sputum. Clear mucus from nose but not much head congestion. No fever, chills or sore throat No antibiotic.  04/09/15- 27 yoF never smoker-followed for asthma/ bronchitis and allergies FOLLOWS FOR: Pt states she feels much improved since last OV with CY. Pt denies SOB, cough, and CP/tightness. Pt states overall she is doing well and denies any complaints.  Realizes flare last ov was after helping clean very dusty home over several days. Using rescue inhaler 1x/ 2-3 weeks. Allergy profile 01/08/15- elevated for dust mites, dog and cat. She keeps her dog/ cat out of bedroom. CXR 01/08/15 IMPRESSION: No active cardiopulmonary disease. Electronically Signed  By: Marcello Moores Register  On: 01/08/2015 10:03  ROS-see HPI Constitutional:   No-    weight loss, night sweats, fevers, chills, fatigue, lassitude. HEENT:   No-  headaches, difficulty swallowing, tooth/dental problems, sore throat,       No-  sneezing, itching, ear ache, nasal congestion, post nasal drip,  CV:  No-   chest pain, orthopnea, PND, swelling in lower extremities, anasarca,                                                      dizziness, palpitations Resp: No-   shortness of breath with exertion or at rest.                productive cough,  No non-productive cough,  No- coughing up of blood.              change in color of mucus.  +Occasional wheezing.   Skin: No-   rash or lesions. GI:  No-   heartburn, indigestion, abdominal pain, nausea, vomiting,  GU: . MS:  No-   joint pain or swelling. Neuro-     nothing unusual Psych:  No- change in mood or affect. No depression or anxiety.  No memory loss.  OBJ- Physical Exam General- Alert, Oriented, Affect-appropriate, Distress- none acute Skin- rash-none, lesions- none, excoriation- none Lymphadenopathy- none Head- atraumatic            Eyes- Gross vision intact, PERRLA, conjunctivae and secretions clear            Ears- Hearing, canals-normal  Nose-  turbinate edema, no-Septal dev, mucus, polyps, erosion, perforation             Throat- Mallampati II-III , mucosa clear , drainage- none, tonsils- atrophic Neck- flexible , trachea midline, no stridor , thyroid nl, carotid no bruit Chest - symmetrical excursion , unlabored           Heart/CV- RRR , no murmur , no gallop  , no rub, nl s1 s2                           - JVD- none , edema- none, stasis changes- none, varices- none           Lung- clear to P&A, wheeze-none, cough-none , dullness-none, rub- none           Chest wall-  Abd-  Br/ Gen/ Rectal- Not done, not indicated Extrem- cyanosis- none, clubbing, none, atrophy- none, strength- nl Neuro- grossly intact to observation

## 2015-04-09 NOTE — Assessment & Plan Note (Signed)
We reviewed CXR and lab results. Allergy profile emphasizes importance or dust/ animal exposure controls as reviewed.

## 2015-04-09 NOTE — Assessment & Plan Note (Signed)
Well controlled for now in early fall. Antihistamines if needed

## 2015-08-16 ENCOUNTER — Ambulatory Visit: Payer: BLUE CROSS/BLUE SHIELD | Admitting: Internal Medicine

## 2015-11-19 ENCOUNTER — Ambulatory Visit: Payer: Medicare Other | Admitting: Internal Medicine

## 2016-02-11 ENCOUNTER — Ambulatory Visit: Payer: Medicare Other | Admitting: Internal Medicine

## 2016-04-11 ENCOUNTER — Ambulatory Visit: Payer: Medicare Other | Admitting: Internal Medicine

## 2018-09-24 ENCOUNTER — Other Ambulatory Visit: Payer: Self-pay | Admitting: Geriatric Medicine

## 2018-09-24 ENCOUNTER — Other Ambulatory Visit (HOSPITAL_COMMUNITY)
Admission: RE | Admit: 2018-09-24 | Discharge: 2018-09-24 | Disposition: A | Discharge status: Home / Self Care | Payer: Medicare Other | Source: Ambulatory Visit | Attending: Geriatric Medicine | Admitting: Geriatric Medicine

## 2018-09-24 DIAGNOSIS — M810 Age-related osteoporosis without current pathological fracture: Secondary | ICD-10-CM

## 2018-09-24 DIAGNOSIS — Z01411 Encounter for gynecological examination (general) (routine) with abnormal findings: Secondary | ICD-10-CM | POA: Insufficient documentation

## 2018-09-24 DIAGNOSIS — Z1231 Encounter for screening mammogram for malignant neoplasm of breast: Secondary | ICD-10-CM

## 2018-09-26 ENCOUNTER — Ambulatory Visit
Admission: RE | Admit: 2018-09-26 | Discharge: 2018-09-26 | Disposition: A | Discharge status: Home / Self Care | Payer: Medicare Other | Source: Ambulatory Visit | Attending: Geriatric Medicine | Admitting: Geriatric Medicine

## 2018-09-26 DIAGNOSIS — Z1231 Encounter for screening mammogram for malignant neoplasm of breast: Secondary | ICD-10-CM

## 2018-09-27 LAB — CYTOLOGY - PAP
DIAGNOSIS: NEGATIVE
HPV: NOT DETECTED

## 2018-10-17 ENCOUNTER — Encounter: Payer: Self-pay | Admitting: Internal Medicine

## 2018-10-17 ENCOUNTER — Ambulatory Visit: Payer: Medicare Other | Admitting: Internal Medicine

## 2018-10-17 ENCOUNTER — Other Ambulatory Visit: Payer: Self-pay

## 2018-10-17 VITALS — BP 154/80 | HR 87 | Ht 62.5 in | Wt 144.4 lb

## 2018-10-17 DIAGNOSIS — J4520 Mild intermittent asthma, uncomplicated: Secondary | ICD-10-CM | POA: Diagnosis not present

## 2018-10-17 DIAGNOSIS — J302 Other seasonal allergic rhinitis: Secondary | ICD-10-CM

## 2018-10-17 DIAGNOSIS — J3089 Other allergic rhinitis: Secondary | ICD-10-CM | POA: Diagnosis not present

## 2018-10-17 MED ORDER — ALBUTEROL SULFATE HFA 108 (90 BASE) MCG/ACT IN AERS: INHALATION_SPRAY | RESPIRATORY_TRACT | 99 refills | Status: DC

## 2018-10-17 NOTE — Patient Instructions (Signed)
Renewal script sent for albuterol rescue inhaler  Please call if we can help

## 2018-10-17 NOTE — Progress Notes (Signed)
HPI F never smoker-followed for asthma/ bronchitis and allergies complicated by PE, hypercholesterol, prediabetic Allergy profile 01/08/15- elevated for dust mites, dog and cat. She keeps her dog/ cat out of bedroom  -----------------------------------------------------------------------------  04/09/15- 63 yoF never smoker-followed for asthma/ bronchitis and allergies FOLLOWS FOR: Pt states she feels much improved since last OV with CY. Pt denies SOB, cough, and CP/tightness. Pt states overall she is doing well and denies any complaints.  Realizes flare last ov was after helping clean very dusty home over several days. Using rescue inhaler 1x/ 2-3 weeks. Allergy profile 01/08/15- elevated for dust mites, dog and cat. She keeps her dog/ cat out of bedroom. CXR 01/08/15 IMPRESSION: No active cardiopulmonary disease. Electronically Signed  By: Marcello Moores Register  On: 01/08/2015 10:03  10/17/2018-  32 yoF never smoker-followed for asthma/ bronchitis and allergies, complicated by PE, hypercholesterol, prediabetic ---breathing stable for past 2 years; has albuterol, but doesn't use it She would like to keep rescue inhaler available but has not used one in 2 years.  Denies wheeze. Current Covid-19 anxiety got her thinking she should touch base here, but she denies any active symptoms or exposure.  ROS-see HPI  + = positive Constitutional:   No-   weight loss, night sweats, fevers, chills, fatigue, lassitude. HEENT:   No-  headaches, difficulty swallowing, tooth/dental problems, sore throat,       No-  sneezing, itching, ear ache, nasal congestion, post nasal drip,  CV:  No-   chest pain, orthopnea, PND, swelling in lower extremities, anasarca,                                                  dizziness, palpitations Resp: No-   shortness of breath with exertion or at rest.                productive cough,  No non-productive cough,  No- coughing up of blood.              change in color of mucus.   +Occasional wheezing.   Skin: No-   rash or lesions. GI:  No-   heartburn, indigestion, abdominal pain, nausea, vomiting,  GU: . MS:  No-   joint pain or swelling. Neuro-     nothing unusual Psych:  No- change in mood or affect. No depression or anxiety.  No memory loss.  OBJ- Physical Exam General- Alert, Oriented, Affect-appropriate, Distress- none acute Skin- rash-none, lesions- none, excoriation- none Lymphadenopathy- none Head- atraumatic            Eyes- Gross vision intact, PERRLA, conjunctivae and secretions clear            Ears- Hearing, canals-normal            Nose-  turbinate edema, no-Septal dev, mucus, polyps, erosion, perforation             Throat- Mallampati II-III , mucosa clear , drainage- none, tonsils- atrophic Neck- flexible , trachea midline, no stridor , thyroid nl, carotid no bruit Chest - symmetrical excursion , unlabored           Heart/CV- RRR , no murmur , no gallop  , no rub, nl s1 s2                           -  JVD- none , edema- none, stasis changes- none, varices- none           Lung- clear to P&A, wheeze-none, cough-none , dullness-none, rub- none           Chest wall-  + kyphosis Abd-  Br/ Gen/ Rectal- Not done, not indicated Extrem- cyanosis- none, clubbing, none, atrophy- none, strength- nl Neuro- grossly intact to observation

## 2018-10-20 NOTE — Assessment & Plan Note (Signed)
Is not yet noting seasonal exacerbation.  Plan- flonase/ claritin if needed

## 2018-10-20 NOTE — Assessment & Plan Note (Signed)
No recent exacerbation Plan- albuterol hfa refilled to have available, at her request, with discussion.

## 2018-11-26 ENCOUNTER — Other Ambulatory Visit: Payer: Medicare Other

## 2019-01-27 ENCOUNTER — Ambulatory Visit
Admission: RE | Admit: 2019-01-27 | Discharge: 2019-01-27 | Disposition: A | Discharge status: Home / Self Care | Payer: Medicare Other | Source: Ambulatory Visit | Attending: Geriatric Medicine | Admitting: Geriatric Medicine

## 2019-01-27 ENCOUNTER — Other Ambulatory Visit: Payer: Self-pay

## 2019-01-27 DIAGNOSIS — M810 Age-related osteoporosis without current pathological fracture: Secondary | ICD-10-CM

## 2019-03-25 ENCOUNTER — Other Ambulatory Visit: Payer: Self-pay

## 2019-03-25 MED ORDER — ALBUTEROL SULFATE HFA 108 (90 BASE) MCG/ACT IN AERS: 2.0000 | INHALATION_SPRAY | Freq: Four times a day (QID) | RESPIRATORY_TRACT | 1 refills | Status: DC | PRN

## 2019-08-15 ENCOUNTER — Other Ambulatory Visit: Payer: Self-pay | Admitting: Geriatric Medicine

## 2019-08-15 DIAGNOSIS — Z1231 Encounter for screening mammogram for malignant neoplasm of breast: Secondary | ICD-10-CM

## 2019-09-29 ENCOUNTER — Ambulatory Visit
Admission: RE | Admit: 2019-09-29 | Discharge: 2019-09-29 | Disposition: A | Discharge status: Home / Self Care | Payer: Medicare Other | Source: Ambulatory Visit | Attending: Geriatric Medicine | Admitting: Geriatric Medicine

## 2019-09-29 ENCOUNTER — Other Ambulatory Visit: Payer: Self-pay

## 2019-09-29 DIAGNOSIS — Z1231 Encounter for screening mammogram for malignant neoplasm of breast: Secondary | ICD-10-CM

## 2019-10-17 ENCOUNTER — Ambulatory Visit: Payer: Medicare Other | Admitting: Internal Medicine

## 2019-10-17 ENCOUNTER — Encounter: Payer: Self-pay | Admitting: Internal Medicine

## 2019-10-17 ENCOUNTER — Other Ambulatory Visit: Payer: Self-pay

## 2019-10-17 DIAGNOSIS — J3089 Other allergic rhinitis: Secondary | ICD-10-CM

## 2019-10-17 DIAGNOSIS — J302 Other seasonal allergic rhinitis: Secondary | ICD-10-CM

## 2019-10-17 DIAGNOSIS — J4520 Mild intermittent asthma, uncomplicated: Secondary | ICD-10-CM

## 2019-10-17 MED ORDER — ALBUTEROL SULFATE HFA 108 (90 BASE) MCG/ACT IN AERS: 2.0000 | INHALATION_SPRAY | Freq: Four times a day (QID) | RESPIRATORY_TRACT | 12 refills | Status: DC | PRN

## 2019-10-17 NOTE — Patient Instructions (Signed)
We refilled your Proair albuterol inhaler   Please call if we can help

## 2019-10-17 NOTE — Progress Notes (Signed)
HPI F never smoker-followed for asthma/ bronchitis and allergies complicated by PE, hypercholesterol, prediabetic Allergy profile 01/08/15- elevated for dust mites, dog and cat. She keeps her dog/ cat out of bedroom  -----------------------------------------------------------------------------   10/17/2018-  67 yoF never smoker-followed for asthma/ bronchitis and allergies, complicated by PE, hypercholesterol, prediabetic ---breathing stable for past 2 years; has albuterol, but doesn't use it She would like to keep rescue inhaler available but has not used one in 2 years.  Denies wheeze. Current Covid-19 anxiety got her thinking she should touch base here, but she denies any active symptoms or exposure.  10/17/19-  22 yoF never smoker-followed for asthma/ bronchitis and allergies, complicated by PE, hypercholesterol, prediabetic ------f/u Mild intermittent asthma. Breathing is at her baseline.  Has had 2 Moderna Covax Only using rescue inhaler 3-4 x/ month. Expects seasonal increase Spring and Fall usually, but not enough now to need maintenance. She wishes to continue to be followed here. Denies interval medical problems.  // Need to verify, document hx PE//   ROS-see HPI  + = positive Constitutional:   No-   weight loss, night sweats, fevers, chills, fatigue, lassitude. HEENT:   No-  headaches, difficulty swallowing, tooth/dental problems, sore throat,       No-  sneezing, itching, ear ache, nasal congestion, post nasal drip,  CV:  No-   chest pain, orthopnea, PND, swelling in lower extremities, anasarca,                                                  dizziness, palpitations Resp: No-   shortness of breath with exertion or at rest.                productive cough,  No non-productive cough,  No- coughing up of blood.              change in color of mucus.  +Occasional wheezing.   Skin: No-   rash or lesions. GI:  No-   heartburn, indigestion, abdominal pain, nausea, vomiting,  GU:  . MS:  No-   joint pain or swelling. Neuro-     nothing unusual Psych:  No- change in mood or affect. No depression or anxiety.  No memory loss.  OBJ- Physical Exam General- Alert, Oriented, Affect-appropriate, Distress- none acute Skin- rash-none, lesions- none, excoriation- none Lymphadenopathy- none Head- atraumatic            Eyes- Gross vision intact, PERRLA, conjunctivae and secretions clear            Ears- Hearing, canals-normal            Nose-  turbinate edema, no-Septal dev, mucus, polyps, erosion, perforation             Throat- Mallampati II-III , mucosa clear , drainage- none, tonsils- atrophic Neck- flexible , trachea midline, no stridor , thyroid nl, carotid no bruit Chest - symmetrical excursion , unlabored           Heart/CV- RRR , no murmur , no gallop  , no rub, nl s1 s2                           - JVD- none , edema- none, stasis changes- none, varices- none           Lung- clear  to P&A, wheeze-none, cough-none , dullness-none, rub- none           Chest wall-  + kyphosis Abd-  Br/ Gen/ Rectal- Not done, not indicated Extrem- cyanosis- none, clubbing, none, atrophy- none, strength- nl Neuro- grossly intact to observation

## 2019-10-17 NOTE — Assessment & Plan Note (Signed)
Currently well-controlled using just occasional rescue inhaler.  Medication discussed Plan- refill albuterol

## 2019-10-17 NOTE — Assessment & Plan Note (Signed)
Currently controlled as we begin Spring pollen season.  Plan- emphasize flonase/ antihistamine if needed

## 2020-02-15 ENCOUNTER — Other Ambulatory Visit: Payer: Self-pay | Admitting: Internal Medicine

## 2020-08-04 DIAGNOSIS — H25013 Cortical age-related cataract, bilateral: Secondary | ICD-10-CM | POA: Diagnosis not present

## 2020-08-04 DIAGNOSIS — H52203 Unspecified astigmatism, bilateral: Secondary | ICD-10-CM | POA: Diagnosis not present

## 2020-08-04 DIAGNOSIS — H5203 Hypermetropia, bilateral: Secondary | ICD-10-CM | POA: Diagnosis not present

## 2020-08-19 DIAGNOSIS — I10 Essential (primary) hypertension: Secondary | ICD-10-CM | POA: Diagnosis not present

## 2020-08-19 DIAGNOSIS — M810 Age-related osteoporosis without current pathological fracture: Secondary | ICD-10-CM | POA: Diagnosis not present

## 2020-08-19 DIAGNOSIS — E7800 Pure hypercholesterolemia, unspecified: Secondary | ICD-10-CM | POA: Diagnosis not present

## 2020-08-19 DIAGNOSIS — J4520 Mild intermittent asthma, uncomplicated: Secondary | ICD-10-CM | POA: Diagnosis not present

## 2020-09-30 ENCOUNTER — Other Ambulatory Visit: Payer: Self-pay | Admitting: Geriatric Medicine

## 2020-09-30 DIAGNOSIS — Z1231 Encounter for screening mammogram for malignant neoplasm of breast: Secondary | ICD-10-CM

## 2020-10-16 NOTE — Progress Notes (Signed)
HPI F never smoker-followed for asthma/ bronchitis and allergies complicated by PE, hypercholesterol, prediabetic Allergy profile 01/08/15- elevated for dust mites, dog and cat. She keeps her dog/ cat out of bedroom  ----------------------------------------------------------------------------- .  10/17/19-  68 yoF never smoker-followed for asthma/ bronchitis and allergies, complicated by PE, hypercholesterol, prediabetic ------f/u Mild intermittent asthma. Breathing is at her baseline.  Has had 2 Moderna Covax Only using rescue inhaler 3-4 x/ month. Expects seasonal increase Spring and Fall usually, but not enough now to need maintenance. She wishes to continue to be followed here. Denies interval medical problems.  10/18/20- 39 yoF never smoker-followed for asthma/ bronchitis and allergies, complicated by , hypercholesterol, prediabetic, HTN, Ulceratove Proctitis, Hypercalcemia,  -Ventolin hfa,  Covid vax-3 Moderna Flu vax-had Pneumonia vax- pneumovax 23 2020 Reports doing very well . No acute issues. Using rescue hfa 1x/ month. No flare of rhinitis so far this Spring, but has otc meds if needed.  Note arrival BP 172/ 80- to f/u with PCP.  ROS-see HPI  + = positive Constitutional:   No-   weight loss, night sweats, fevers, chills, fatigue, lassitude. HEENT:   No-  headaches, difficulty swallowing, tooth/dental problems, sore throat,       No-  sneezing, itching, ear ache, nasal congestion, post nasal drip,  CV:  No-   chest pain, orthopnea, PND, swelling in lower extremities, anasarca,                                                  dizziness, palpitations Resp: No-   shortness of breath with exertion or at rest.                productive cough,  No non-productive cough,  No- coughing up of blood.              change in color of mucus.  +Occasional wheezing.   Skin: No-   rash or lesions. GI:  No-   heartburn, indigestion, abdominal pain, nausea, vomiting,  GU: . MS:  No-   joint pain  or swelling. Neuro-     nothing unusual Psych:  No- change in mood or affect. No depression or anxiety.  No memory loss.  OBJ- Physical Exam General- Alert, Oriented, Affect-appropriate, Distress- none acute Skin- rash-none, lesions- none, excoriation- none Lymphadenopathy- none Head- atraumatic            Eyes- Gross vision intact, PERRLA, conjunctivae and secretions clear            Ears- Hearing, canals-normal            Nose-  turbinate edema, no-Septal dev, mucus, polyps, erosion, perforation             Throat- Mallampati II-III , mucosa clear , drainage- none, tonsils- atrophic Neck- flexible , trachea midline, no stridor , thyroid nl, carotid no bruit Chest - symmetrical excursion , unlabored           Heart/CV- RRR , no murmur , no gallop  , no rub, nl s1 s2                           - JVD- none , edema- none, stasis changes- none, varices- none           Lung- clear to P&A, wheeze-none, cough-none ,  dullness-none, rub- none           Chest wall-  + kyphosis Abd-  Br/ Gen/ Rectal- Not done, not indicated Extrem- cyanosis- none, clubbing, none, atrophy- none, strength- nl Neuro- grossly intact to observation

## 2020-10-18 ENCOUNTER — Encounter: Payer: Self-pay | Admitting: Internal Medicine

## 2020-10-18 ENCOUNTER — Other Ambulatory Visit: Payer: Self-pay

## 2020-10-18 ENCOUNTER — Ambulatory Visit: Payer: Medicare Other | Admitting: Internal Medicine

## 2020-10-18 DIAGNOSIS — J302 Other seasonal allergic rhinitis: Secondary | ICD-10-CM

## 2020-10-18 DIAGNOSIS — R030 Elevated blood-pressure reading, without diagnosis of hypertension: Secondary | ICD-10-CM

## 2020-10-18 DIAGNOSIS — J3089 Other allergic rhinitis: Secondary | ICD-10-CM | POA: Diagnosis not present

## 2020-10-18 DIAGNOSIS — J4520 Mild intermittent asthma, uncomplicated: Secondary | ICD-10-CM

## 2020-10-18 NOTE — Patient Instructions (Signed)
Glad you are doing well   We sill be happy to see you again if we can help.

## 2020-10-20 DIAGNOSIS — E7800 Pure hypercholesterolemia, unspecified: Secondary | ICD-10-CM | POA: Diagnosis not present

## 2020-10-20 DIAGNOSIS — I10 Essential (primary) hypertension: Secondary | ICD-10-CM | POA: Diagnosis not present

## 2020-10-20 DIAGNOSIS — K5120 Ulcerative (chronic) proctitis without complications: Secondary | ICD-10-CM | POA: Diagnosis not present

## 2020-10-20 DIAGNOSIS — Z79899 Other long term (current) drug therapy: Secondary | ICD-10-CM | POA: Diagnosis not present

## 2020-10-20 DIAGNOSIS — M810 Age-related osteoporosis without current pathological fracture: Secondary | ICD-10-CM | POA: Diagnosis not present

## 2020-10-20 DIAGNOSIS — J4520 Mild intermittent asthma, uncomplicated: Secondary | ICD-10-CM | POA: Diagnosis not present

## 2020-10-20 DIAGNOSIS — Z23 Encounter for immunization: Secondary | ICD-10-CM | POA: Diagnosis not present

## 2020-10-20 DIAGNOSIS — R7303 Prediabetes: Secondary | ICD-10-CM | POA: Diagnosis not present

## 2020-10-20 DIAGNOSIS — Z0000 Encounter for general adult medical examination without abnormal findings: Secondary | ICD-10-CM | POA: Diagnosis not present

## 2020-10-27 DIAGNOSIS — J4520 Mild intermittent asthma, uncomplicated: Secondary | ICD-10-CM | POA: Diagnosis not present

## 2020-10-27 DIAGNOSIS — M810 Age-related osteoporosis without current pathological fracture: Secondary | ICD-10-CM | POA: Diagnosis not present

## 2020-10-27 DIAGNOSIS — E7800 Pure hypercholesterolemia, unspecified: Secondary | ICD-10-CM | POA: Diagnosis not present

## 2020-10-27 DIAGNOSIS — I10 Essential (primary) hypertension: Secondary | ICD-10-CM | POA: Diagnosis not present

## 2020-11-17 DIAGNOSIS — I10 Essential (primary) hypertension: Secondary | ICD-10-CM | POA: Diagnosis not present

## 2020-11-17 DIAGNOSIS — E7800 Pure hypercholesterolemia, unspecified: Secondary | ICD-10-CM | POA: Diagnosis not present

## 2020-11-17 DIAGNOSIS — J4520 Mild intermittent asthma, uncomplicated: Secondary | ICD-10-CM | POA: Diagnosis not present

## 2020-11-17 DIAGNOSIS — M810 Age-related osteoporosis without current pathological fracture: Secondary | ICD-10-CM | POA: Diagnosis not present

## 2020-11-22 ENCOUNTER — Ambulatory Visit: Payer: Medicare Other

## 2021-01-06 ENCOUNTER — Ambulatory Visit
Admission: RE | Admit: 2021-01-06 | Discharge: 2021-01-06 | Disposition: A | Discharge status: Home / Self Care | Payer: Medicare Other | Source: Ambulatory Visit | Attending: Geriatric Medicine | Admitting: Geriatric Medicine

## 2021-01-06 ENCOUNTER — Other Ambulatory Visit: Payer: Self-pay

## 2021-01-06 DIAGNOSIS — Z1231 Encounter for screening mammogram for malignant neoplasm of breast: Secondary | ICD-10-CM

## 2021-01-25 DIAGNOSIS — I10 Essential (primary) hypertension: Secondary | ICD-10-CM | POA: Diagnosis not present

## 2021-01-25 DIAGNOSIS — R7303 Prediabetes: Secondary | ICD-10-CM | POA: Diagnosis not present

## 2021-02-21 DIAGNOSIS — I10 Essential (primary) hypertension: Secondary | ICD-10-CM | POA: Diagnosis not present

## 2021-02-21 DIAGNOSIS — M810 Age-related osteoporosis without current pathological fracture: Secondary | ICD-10-CM | POA: Diagnosis not present

## 2021-02-21 DIAGNOSIS — J4520 Mild intermittent asthma, uncomplicated: Secondary | ICD-10-CM | POA: Diagnosis not present

## 2021-02-21 DIAGNOSIS — E7800 Pure hypercholesterolemia, unspecified: Secondary | ICD-10-CM | POA: Diagnosis not present

## 2021-02-27 ENCOUNTER — Encounter: Payer: Self-pay | Admitting: Internal Medicine

## 2021-02-27 DIAGNOSIS — R030 Elevated blood-pressure reading, without diagnosis of hypertension: Secondary | ICD-10-CM | POA: Insufficient documentation

## 2021-02-27 NOTE — Assessment & Plan Note (Signed)
Mild intermittent uncomplicated Plan- continue rescue inhaler as needed

## 2021-02-27 NOTE — Assessment & Plan Note (Signed)
Currently doing well

## 2021-02-27 NOTE — Assessment & Plan Note (Signed)
170/ 80 here today Plan- discuss with PCP

## 2021-04-26 DIAGNOSIS — I10 Essential (primary) hypertension: Secondary | ICD-10-CM | POA: Diagnosis not present

## 2021-04-26 DIAGNOSIS — E7800 Pure hypercholesterolemia, unspecified: Secondary | ICD-10-CM | POA: Diagnosis not present

## 2021-04-26 DIAGNOSIS — J4520 Mild intermittent asthma, uncomplicated: Secondary | ICD-10-CM | POA: Diagnosis not present

## 2021-04-26 DIAGNOSIS — M810 Age-related osteoporosis without current pathological fracture: Secondary | ICD-10-CM | POA: Diagnosis not present

## 2021-05-18 DIAGNOSIS — Z23 Encounter for immunization: Secondary | ICD-10-CM | POA: Diagnosis not present

## 2021-05-18 DIAGNOSIS — R7303 Prediabetes: Secondary | ICD-10-CM | POA: Diagnosis not present

## 2021-05-18 DIAGNOSIS — I10 Essential (primary) hypertension: Secondary | ICD-10-CM | POA: Diagnosis not present

## 2021-07-26 DIAGNOSIS — E7800 Pure hypercholesterolemia, unspecified: Secondary | ICD-10-CM | POA: Diagnosis not present

## 2021-07-26 DIAGNOSIS — I10 Essential (primary) hypertension: Secondary | ICD-10-CM | POA: Diagnosis not present

## 2021-07-26 DIAGNOSIS — M810 Age-related osteoporosis without current pathological fracture: Secondary | ICD-10-CM | POA: Diagnosis not present

## 2021-07-26 DIAGNOSIS — J4520 Mild intermittent asthma, uncomplicated: Secondary | ICD-10-CM | POA: Diagnosis not present

## 2021-08-08 DIAGNOSIS — H5203 Hypermetropia, bilateral: Secondary | ICD-10-CM | POA: Diagnosis not present

## 2021-08-08 DIAGNOSIS — H25813 Combined forms of age-related cataract, bilateral: Secondary | ICD-10-CM | POA: Diagnosis not present

## 2021-08-08 DIAGNOSIS — H52203 Unspecified astigmatism, bilateral: Secondary | ICD-10-CM | POA: Diagnosis not present

## 2021-08-23 DIAGNOSIS — Z79899 Other long term (current) drug therapy: Secondary | ICD-10-CM | POA: Diagnosis not present

## 2021-08-23 DIAGNOSIS — I10 Essential (primary) hypertension: Secondary | ICD-10-CM | POA: Diagnosis not present

## 2021-08-23 DIAGNOSIS — R7303 Prediabetes: Secondary | ICD-10-CM | POA: Diagnosis not present

## 2021-10-31 DIAGNOSIS — K5120 Ulcerative (chronic) proctitis without complications: Secondary | ICD-10-CM | POA: Diagnosis not present

## 2021-11-08 DIAGNOSIS — Z0000 Encounter for general adult medical examination without abnormal findings: Secondary | ICD-10-CM | POA: Diagnosis not present

## 2021-11-08 DIAGNOSIS — Z23 Encounter for immunization: Secondary | ICD-10-CM | POA: Diagnosis not present

## 2021-11-08 DIAGNOSIS — R7303 Prediabetes: Secondary | ICD-10-CM | POA: Diagnosis not present

## 2021-11-08 DIAGNOSIS — Z79899 Other long term (current) drug therapy: Secondary | ICD-10-CM | POA: Diagnosis not present

## 2021-11-08 DIAGNOSIS — I10 Essential (primary) hypertension: Secondary | ICD-10-CM | POA: Diagnosis not present

## 2021-11-08 DIAGNOSIS — J4520 Mild intermittent asthma, uncomplicated: Secondary | ICD-10-CM | POA: Diagnosis not present

## 2021-11-08 DIAGNOSIS — M810 Age-related osteoporosis without current pathological fracture: Secondary | ICD-10-CM | POA: Diagnosis not present

## 2021-12-05 ENCOUNTER — Other Ambulatory Visit: Payer: Self-pay | Admitting: Geriatric Medicine

## 2021-12-05 DIAGNOSIS — Z1231 Encounter for screening mammogram for malignant neoplasm of breast: Secondary | ICD-10-CM

## 2021-12-28 DIAGNOSIS — L814 Other melanin hyperpigmentation: Secondary | ICD-10-CM | POA: Diagnosis not present

## 2021-12-28 DIAGNOSIS — L570 Actinic keratosis: Secondary | ICD-10-CM | POA: Diagnosis not present

## 2022-01-09 ENCOUNTER — Ambulatory Visit
Admission: RE | Admit: 2022-01-09 | Discharge: 2022-01-09 | Disposition: A | Discharge status: Home / Self Care | Payer: Medicare Other | Source: Ambulatory Visit | Attending: Geriatric Medicine | Admitting: Geriatric Medicine

## 2022-01-09 DIAGNOSIS — Z1231 Encounter for screening mammogram for malignant neoplasm of breast: Secondary | ICD-10-CM

## 2022-01-11 ENCOUNTER — Other Ambulatory Visit: Payer: Self-pay | Admitting: Geriatric Medicine

## 2022-01-11 DIAGNOSIS — R928 Other abnormal and inconclusive findings on diagnostic imaging of breast: Secondary | ICD-10-CM

## 2022-01-26 ENCOUNTER — Ambulatory Visit
Admission: RE | Admit: 2022-01-26 | Discharge: 2022-01-26 | Disposition: A | Discharge status: Home / Self Care | Payer: Medicare Other | Source: Ambulatory Visit | Attending: Geriatric Medicine | Admitting: Geriatric Medicine

## 2022-01-26 ENCOUNTER — Other Ambulatory Visit: Payer: Self-pay | Admitting: Geriatric Medicine

## 2022-01-26 DIAGNOSIS — N6489 Other specified disorders of breast: Secondary | ICD-10-CM

## 2022-01-26 DIAGNOSIS — R928 Other abnormal and inconclusive findings on diagnostic imaging of breast: Secondary | ICD-10-CM

## 2022-01-28 DIAGNOSIS — C801 Malignant (primary) neoplasm, unspecified: Secondary | ICD-10-CM

## 2022-01-28 HISTORY — DX: Malignant (primary) neoplasm, unspecified: C80.1

## 2022-01-30 ENCOUNTER — Ambulatory Visit
Admission: RE | Admit: 2022-01-30 | Discharge: 2022-01-30 | Disposition: A | Discharge status: Home / Self Care | Payer: Medicare Other | Source: Ambulatory Visit | Attending: Geriatric Medicine | Admitting: Geriatric Medicine

## 2022-01-30 ENCOUNTER — Other Ambulatory Visit: Payer: Self-pay | Admitting: Geriatric Medicine

## 2022-01-30 ENCOUNTER — Other Ambulatory Visit: Payer: Self-pay | Admitting: Diagnostic Radiology

## 2022-01-30 DIAGNOSIS — N6489 Other specified disorders of breast: Secondary | ICD-10-CM

## 2022-01-30 DIAGNOSIS — C50812 Malignant neoplasm of overlapping sites of left female breast: Secondary | ICD-10-CM | POA: Diagnosis not present

## 2022-01-30 DIAGNOSIS — R928 Other abnormal and inconclusive findings on diagnostic imaging of breast: Secondary | ICD-10-CM | POA: Diagnosis not present

## 2022-01-30 HISTORY — PX: BREAST BIOPSY: SHX20

## 2022-02-02 ENCOUNTER — Telehealth: Payer: Self-pay | Admitting: Hematology and Oncology

## 2022-02-02 NOTE — Telephone Encounter (Signed)
Spoke to patient to confirm morning clinic appointment for 7/12, packet mailed to patient

## 2022-02-06 ENCOUNTER — Ambulatory Visit: Payer: Self-pay | Admitting: Surgery

## 2022-02-06 ENCOUNTER — Encounter: Payer: Self-pay | Admitting: *Deleted

## 2022-02-06 DIAGNOSIS — Z170 Estrogen receptor positive status [ER+]: Secondary | ICD-10-CM | POA: Insufficient documentation

## 2022-02-06 NOTE — Progress Notes (Signed)
Radiation Oncology         [phone] ________________________________  Name: Becky Olson        MRN: [id number]  Date of Service: 02/08/2022 DOB: May 12, 1951  XB:[id number], [name], MD  Donnie Mesa, MD     REFERRING PHYSICIAN: Donnie Mesa, MD   DIAGNOSIS: The encounter diagnosis was Malignant neoplasm of lower-inner quadrant of left breast in female, estrogen receptor positive ([place]).   HISTORY OF PRESENT ILLNESS: Becky Olson is a 71 y.o. female seen in the multidisciplinary breast clinic for a new diagnosis of left breast cancer. The patient was noted to have a screening detected asymmetry in the left breast on her January 09, 2022 mammogram.  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  Her invasive cancer was ER/PR positive, HER2 negative with a Ki-67 of 2%.  She is seen today to discuss treatment recommendations of her cancer.    PREVIOUS RADIATION THERAPY: No   PAST MEDICAL HISTORY:  Past Medical History:  Diagnosis Date   Asthma        PAST SURGICAL HISTORY: Past Surgical History:  Procedure Laterality Date   APPENDECTOMY  1985   BREAST BIOPSY Left 1985   BREAST EXCISIONAL BIOPSY Left 1985   BREAST SURGERY  1995   COLON SURGERY  1984   GALLBLADDER SURGERY  1984   VESICOVAGINAL FISTULA CLOSURE W/ TAH  1985     FAMILY HISTORY:  Family History  Problem Relation Age of Onset   Cancer Father        abdominal   Prostate cancer Father    Prostate cancer Brother    Prostate cancer Brother    Breast cancer Maternal Aunt        35s     SOCIAL HISTORY:  reports that she has never smoked. She has never used smokeless tobacco. She reports that she does not drink alcohol and does not use drugs.  The patient is single and lives in [place].  She is  retired from working as a tow Systems developer. She's accompanied by her daughter.    ALLERGIES: Patient has no allergy information on record.   MEDICATIONS:  Current Outpatient Medications  Medication Sig Dispense Refill   albuterol (VENTOLIN HFA) 108 (90 Base) MCG/ACT inhaler INHALE 2 PUFFS EVERY 4 HOURS AS DIRECTED - RESCUE INHALER 8.5 g PRN   alendronate (FOSAMAX) 70 MG tablet Take 70 mg by mouth once a week.     amLODipine (NORVASC) 2.5 MG tablet Take 2.5 mg by mouth daily.     atorvastatin (LIPITOR) 10 MG tablet Take 10 mg by mouth daily.     calcium citrate-vitamin D (CITRACAL+D) 315-200 MG-UNIT per tablet Take 1 tablet by mouth daily.     cholecalciferol (VITAMIN D) 25 MCG (1000 UNIT) tablet 1 capsule     hydrochlorothiazide (MICROZIDE) 12.5 MG capsule Take 12.5 mg by mouth daily.     mesalamine (LIALDA) 1.2 g EC tablet      No current facility-administered medications for this encounter.     REVIEW OF SYSTEMS: On review of systems, the patient reports that she is doing well overall without any breast specific complaints      PHYSICAL EXAM:  Wt Readings from Last 3 Encounters:  02/08/22 156 lb (70.8 kg)  10/18/20 150 lb (68 kg)  10/17/19 147  lb (66.7 kg)   Temp Readings from Last 3 Encounters:  02/08/22 98 F (36.7 C) (Tympanic)  10/18/20 97.9 F (36.6 C) (Temporal)  10/17/19 (!) 97.1 F (36.2 C) (Temporal)   BP Readings from Last 3 Encounters:  02/08/22 (!) 177/64  10/18/20 (!) 172/80  10/17/19 (!) 170/68   Pulse Readings from Last 3 Encounters:  02/08/22 94  10/18/20 87  10/17/19 87    In general this is a well appearing Caucasian female in no acute distress. She's alert and oriented x4 and appropriate throughout the examination. Cardiopulmonary assessment is negative for acute distress and she exhibits normal effort. Bilateral breast exam is deferred.    ECOG = 0  0 - Asymptomatic (Fully active, able to carry on all predisease activities without  restriction)  1 - Symptomatic but completely ambulatory (Restricted in physically strenuous activity but ambulatory and able to carry out work of a light or sedentary nature. For example, light housework, office work)  2 - Symptomatic, <50% in bed during the day (Ambulatory and capable of all self care but unable to carry out any work activities. Up and about more than 50% of waking hours)  3 - Symptomatic, >50% in bed, but not bedbound (Capable of only limited self-care, confined to bed or chair 50% or more of waking hours)  4 - Bedbound (Completely disabled. Cannot carry on any self-care. Totally confined to bed or chair)  5 - Death   Eustace Pen MM, Creech RH, Tormey DC, et al. [phone]). "Toxicity and response criteria of the [facility] Group". [place] Oncol. 5 (6): 649-55    LABORATORY DATA:  Lab Results  Component Value Date   WBC 7.7 02/08/2022   HGB 13.7 02/08/2022   HCT 41.5 02/08/2022   MCV 89.4 02/08/2022   PLT 218 02/08/2022   Lab Results  Component Value Date   NA 141 02/08/2022   K 4.1 02/08/2022   CL 103 02/08/2022   CO2 32 02/08/2022   Lab Results  Component Value Date   ALT 25 02/08/2022   AST 22 02/08/2022   ALKPHOS 69 02/08/2022   BILITOT 0.7 02/08/2022      RADIOGRAPHY: MM LT BREAST BX W LOC DEV 1ST LESION IMAGE BX SPEC STEREO GUIDE  Addendum Date: 02/04/2022   ADDENDUM REPORT: 02/04/2022 07:30 ADDENDUM: Pathology revealed Breast, LEFT, needle core biopsy, medial, (coil clip) - GRADE 1 INVASIVE DUCTAL CARCINOMA, WITH TUBULAR FEATURES, FOCAL DUCTAL CARCINOMA IN SITU (DCIS), CRIBRIFORM TYPE, CALCIFICATIONS: PRESENT. This was found to be concordant by Dr. Lovey Newcomer. Pathology results were discussed with the patient by telephone. The patient reported doing well after the biopsy with tenderness at the site. Post biopsy instructions and care were reviewed and questions were answered. The patient was encouraged to call [facility] for any additional concerns. The patient was referred to [facility] on February 08, 2022. Pathology results reported by Stacie Acres RN on 02/02/2022. Electronically Signed   By: Lovey Newcomer M.D.   On: 02/04/2022 07:30   Result Date: 02/04/2022 CLINICAL DATA:  Patient with persistent subtle spiculated left breast asymmetry. EXAM: LEFT BREAST STEREOTACTIC CORE NEEDLE BIOPSY COMPARISON:  None Available. FINDINGS: The patient and I discussed the procedure of stereotactic-guided biopsy including benefits and alternatives. We discussed the high likelihood of a successful procedure. We discussed the risks of the procedure including infection, bleeding, tissue injury, clip migration, and inadequate  sampling. Informed written consent was given. The usual time out protocol was performed immediately prior to the procedure. Using sterile technique and 1% Lidocaine as local anesthetic, under stereotactic guidance, a 9 gauge vacuum assisted device was used to perform core needle biopsy of spiculated medial left breast asymmetry using a cranial approach. Lesion quadrant: Lower inner quadrant At the conclusion of the procedure, coil shaped tissue marker clip was deployed into the biopsy cavity. Follow-up 2-view mammogram was performed and dictated separately. IMPRESSION: Stereotactic-guided biopsy of spiculated medial left breast asymmetry. No apparent complications. Electronically Signed: By: Lovey Newcomer M.D. On: 01/30/2022 08:32  MM CLIP PLACEMENT LEFT  Result Date: 01/30/2022 CLINICAL DATA:  Status post stereo biopsy medial left breast asymmetry EXAM: 3D DIAGNOSTIC LEFT MAMMOGRAM POST STEREOTACTIC BIOPSY COMPARISON:  Previous exam(s). FINDINGS: 3D Mammographic images were obtained following stereotactic guided biopsy of medial left breast asymmetry. The biopsy marking clip as appear to migrate approximally 1.9 cm inferior to the site of  biopsy however the finding was never visualized on a true lateral view. IMPRESSION: The biopsy clip appears to have migrated approximately 1.9 cm inferior to the site of biopsy however the original finding was never able to be visualized on a true lateral view. If localization is needed, recommend repeat true lateral view prior to the localization to identify the likely location of the biopsy after healing. Final Assessment: Post Procedure Mammograms for Marker Placement Electronically Signed   By: Lovey Newcomer M.D.   On: 01/30/2022 08:38  MM DIAG BREAST TOMO UNI LEFT  Result Date: 01/26/2022 CLINICAL DATA:  The patient was called back for a left breast asymmetry. EXAM: DIGITAL DIAGNOSTIC UNILATERAL LEFT MAMMOGRAM WITH TOMOSYNTHESIS AND CAD; ULTRASOUND LEFT BREAST LIMITED TECHNIQUE: Left digital diagnostic mammography and breast tomosynthesis was performed. The images were evaluated with computer-aided detection.; Targeted ultrasound examination of the left breast was performed. COMPARISON:  Previous exam(s). ACR Breast Density Category b: There are scattered areas of fibroglandular density. FINDINGS: The left breast asymmetry persists on today's imaging. There is suggestion of possible irregularity or spiculation. On physical exam, no suspicious lumps are identified. Targeted ultrasound is performed, showing no sonographic correlate for the left breast asymmetry. No axillary adenopathy. IMPRESSION: Persistent possibly irregular/spiculated subtle left breast asymmetry located medially. RECOMMENDATION: Recommend attempted stereotactic biopsy of the left breast asymmetry. If the biopsy cannot be performed due to the subtle nature of the finding, recommend breast MRI. I have discussed the findings and recommendations with the patient. If applicable, a reminder letter will be sent to the patient regarding the next appointment. BI-RADS CATEGORY  4: Suspicious. Electronically Signed   By: Dorise Bullion III M.D.   On:  01/26/2022 10:04  US BREAST LTD UNI LEFT INC AXILLA  Result Date: 01/26/2022 CLINICAL DATA:  The patient was called back for a left breast asymmetry. EXAM: DIGITAL DIAGNOSTIC UNILATERAL LEFT MAMMOGRAM WITH TOMOSYNTHESIS AND CAD; ULTRASOUND LEFT BREAST LIMITED TECHNIQUE: Left digital diagnostic mammography and breast tomosynthesis was performed. The images were evaluated with computer-aided detection.; Targeted ultrasound examination of the left breast was performed. COMPARISON:  Previous exam(s). ACR Breast Density Category b: There are scattered areas of fibroglandular density. FINDINGS: The left breast asymmetry persists on today's imaging. There is suggestion of possible irregularity or spiculation. On physical exam, no suspicious lumps are identified. Targeted ultrasound is performed, showing no sonographic correlate for the left breast asymmetry. No axillary adenopathy. IMPRESSION: Persistent possibly irregular/spiculated subtle left breast asymmetry located medially. RECOMMENDATION: Recommend attempted stereotactic biopsy of  the left breast asymmetry. If the biopsy cannot be performed due to the subtle nature of the finding, recommend breast MRI. I have discussed the findings and recommendations with the patient. If applicable, a reminder letter will be sent to the patient regarding the next appointment. BI-RADS CATEGORY  4: Suspicious. Electronically Signed   By: Dorise Bullion III M.D.   On: 01/26/2022 10:04  MM 3D SCREEN BREAST BILATERAL  Result Date: 01/10/2022 CLINICAL DATA:  Screening. EXAM: DIGITAL SCREENING BILATERAL MAMMOGRAM WITH TOMOSYNTHESIS AND CAD TECHNIQUE: Bilateral screening digital craniocaudal and mediolateral oblique mammograms were obtained. Bilateral screening digital breast tomosynthesis was performed. The images were evaluated with computer-aided detection. COMPARISON:  Previous exam(s). ACR Breast Density Category b: There are scattered areas of fibroglandular density.  FINDINGS: In the left breast, a possible asymmetry warrants further evaluation. In the right breast, no findings suspicious for malignancy. IMPRESSION: Further evaluation is suggested for possible asymmetry in the left breast. RECOMMENDATION: Diagnostic mammogram and possibly ultrasound of the left breast. (Code:[id number]-L-22M) The patient will be contacted regarding the findings, and additional imaging will be scheduled. BI-RADS CATEGORY  0: Incomplete. Need additional imaging evaluation and/or prior mammograms for comparison. Electronically Signed   By: Lovey Newcomer M.D.   On: 01/10/2022 11:26       IMPRESSION/PLAN: 1. At least Stage IA, [id number], grade 1, ER/PR positive invasive ductal carcinoma of the left breast. Dr. Lisbeth Renshaw discusses the pathology findings and reviews the nature of what appears to be early stage breast disease. The consensus from the breast conference includes breast conservation with lumpectomy with possible sentinel node biopsy. Dr. Lisbeth Renshaw discusses the rationale for external radiotherapy to the breast  to reduce risks of local recurrence. Dr. Lisbeth Renshaw also discusses cases in which radiation may be optional for favorable cases based on final pathology. Dr. Chryl Heck will also recommend antiestrogen therapy. We discussed the risks, benefits, short, and long term effects of radiotherapy, as well as the curative intent, and the patient is interested in proceeding. Dr. Lisbeth Renshaw discusses the delivery and logistics of radiotherapy and anticipates a course of 4 weeks of radiotherapy to the left breast with deep inspiration breath-hold technique. We will see her back a few weeks after surgery to discuss the simulation process and anticipate we starting radiotherapy about 4-6 weeks after surgery.  2. Possible genetic predisposition to malignancy. The patient is a candidate for genetic testing given her personal and family history. She will be seen in clinic by genetics today.   In a visit lasting 60 minutes,  greater than 50% of the time was spent face to face reviewing her case, as well as in preparation of, discussing, and coordinating the patient's care.  The above documentation reflects my direct findings during this shared patient visit. Please see the separate note by Dr. Lisbeth Renshaw on this date for the remainder of the patient's plan of care.    Carola Rhine, [facility]    **Disclaimer: This note was dictated with voice recognition software. Similar sounding words can inadvertently be transcribed and this note may contain transcription errors which may not have been corrected upon publication of note.**

## 2022-02-08 ENCOUNTER — Ambulatory Visit
Admission: RE | Admit: 2022-02-08 | Discharge: 2022-02-08 | Disposition: A | Discharge status: Home / Self Care | Payer: Medicare Other | Source: Ambulatory Visit | Attending: Radiation Oncology | Admitting: Radiation Oncology

## 2022-02-08 ENCOUNTER — Inpatient Hospital Stay: Payer: Medicare Other

## 2022-02-08 ENCOUNTER — Other Ambulatory Visit: Payer: Self-pay

## 2022-02-08 ENCOUNTER — Inpatient Hospital Stay (HOSPITAL_BASED_OUTPATIENT_CLINIC_OR_DEPARTMENT_OTHER): Payer: Medicare Other | Admitting: Genetic Counselor

## 2022-02-08 ENCOUNTER — Encounter: Payer: Self-pay | Admitting: Hematology and Oncology

## 2022-02-08 ENCOUNTER — Inpatient Hospital Stay: Payer: Medicare Other | Admitting: Licensed Clinical Social Worker

## 2022-02-08 ENCOUNTER — Encounter: Payer: Self-pay | Admitting: Radiation Oncology

## 2022-02-08 ENCOUNTER — Ambulatory Visit: Payer: Self-pay | Admitting: Surgery

## 2022-02-08 ENCOUNTER — Encounter: Payer: Self-pay | Admitting: Radiology

## 2022-02-08 ENCOUNTER — Encounter: Payer: Self-pay | Admitting: *Deleted

## 2022-02-08 ENCOUNTER — Inpatient Hospital Stay: Payer: Medicare Other | Attending: Hematology and Oncology | Admitting: Hematology and Oncology

## 2022-02-08 DIAGNOSIS — C50312 Malignant neoplasm of lower-inner quadrant of left female breast: Secondary | ICD-10-CM

## 2022-02-08 DIAGNOSIS — Z803 Family history of malignant neoplasm of breast: Secondary | ICD-10-CM | POA: Diagnosis not present

## 2022-02-08 DIAGNOSIS — Z170 Estrogen receptor positive status [ER+]: Secondary | ICD-10-CM

## 2022-02-08 DIAGNOSIS — Z800 Family history of malignant neoplasm of digestive organs: Secondary | ICD-10-CM | POA: Diagnosis not present

## 2022-02-08 DIAGNOSIS — R7303 Prediabetes: Secondary | ICD-10-CM | POA: Insufficient documentation

## 2022-02-08 DIAGNOSIS — Z8042 Family history of malignant neoplasm of prostate: Secondary | ICD-10-CM

## 2022-02-08 DIAGNOSIS — E7800 Pure hypercholesterolemia, unspecified: Secondary | ICD-10-CM | POA: Insufficient documentation

## 2022-02-08 DIAGNOSIS — K5120 Ulcerative (chronic) proctitis without complications: Secondary | ICD-10-CM | POA: Insufficient documentation

## 2022-02-08 LAB — CMP (CANCER CENTER ONLY)
ALT: 25 U/L (ref 0–44)
AST: 22 U/L (ref 15–41)
Albumin: 4.4 g/dL (ref 3.5–5.0)
Alkaline Phosphatase: 69 U/L (ref 38–126)
Anion gap: 6 (ref 5–15)
BUN: 19 mg/dL (ref 8–23)
CO2: 32 mmol/L (ref 22–32)
Calcium: 9.9 mg/dL (ref 8.9–10.3)
Chloride: 103 mmol/L (ref 98–111)
Creatinine: 0.82 mg/dL (ref 0.44–1.00)
GFR, Estimated: >60 mL/min (ref >60)
Glucose, Bld: 171 mg/dL — ABNORMAL HIGH (ref 70–99)
Potassium: 4.1 mmol/L (ref 3.5–5.1)
Sodium: 141 mmol/L (ref 135–145)
Total Bilirubin: 0.7 mg/dL (ref 0.3–1.2)
Total Protein: 7.1 g/dL (ref 6.5–8.1)

## 2022-02-08 LAB — CBC WITH DIFFERENTIAL (CANCER CENTER ONLY)
Abs Immature Granulocytes: 0.03 10*3/uL (ref 0.00–0.07)
Basophils Absolute: 0 10*3/uL (ref 0.0–0.1)
Basophils Relative: 0 %
Eosinophils Absolute: 0.1 10*3/uL (ref 0.0–0.5)
Eosinophils Relative: 1 %
HCT: 41.5 % (ref 36.0–46.0)
Hemoglobin: 13.7 g/dL (ref 12.0–15.0)
Immature Granulocytes: 0 %
Lymphocytes Relative: 38 %
Lymphs Abs: 2.9 10*3/uL (ref 0.7–4.0)
MCH: 29.5 pg (ref 26.0–34.0)
MCHC: 33 g/dL (ref 30.0–36.0)
MCV: 89.4 fL (ref 80.0–100.0)
Monocytes Absolute: 0.5 10*3/uL (ref 0.1–1.0)
Monocytes Relative: 7 %
Neutro Abs: 4.1 10*3/uL (ref 1.7–7.7)
Neutrophils Relative %: 54 %
Platelet Count: 218 10*3/uL (ref 150–400)
RBC: 4.64 MIL/uL (ref 3.87–5.11)
RDW: 12.5 % (ref 11.5–15.5)
WBC Count: 7.7 10*3/uL (ref 4.0–10.5)
nRBC: 0 % (ref 0.0–0.2)

## 2022-02-08 LAB — RESEARCH LABS

## 2022-02-08 LAB — GENETIC SCREENING ORDER

## 2022-02-08 NOTE — H&P (Signed)
Subjective    Chief Complaint: Breast Cancer   Breast [place] 02/08/22 - [place] [place]   History of Present Illness: Becky Olson is a 71 y.o. female who is seen today as an office consultation at the request of Dr. Felipa Eth for evaluation of Breast Cancer .     This is a 71 year old female who presents with a recent routine screening mammogram that revealed some asymmetry in the left lower inner quadrant.  There was no sonographic correlate.  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  The tumor is not estimated to measure 7 mm in diameter.  She has had a previous biopsy of the left breast at age 43 for calcifications.  Biopsy was benign.  She has had multiple abdominal surgeries.  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  No blood thinners.  Family history of breast cancer both a maternal as well as a paternal aunt.     Review of Systems: A complete review of systems was obtained from the patient.  I have reviewed this information and discussed as appropriate with the patient.  See HPI as well for other ROS.   Review of Systems  Constitutional: Negative.   HENT: Negative.    Eyes:  Positive for blurred vision.  Respiratory: Negative.    Cardiovascular: Negative.   Gastrointestinal: Negative.   Genitourinary: Negative.   Musculoskeletal:  Positive for joint pain.  Skin: Negative.   Neurological: Negative.   Endo/Heme/Allergies: Negative.   Psychiatric/Behavioral: Negative.         Medical History: Past Medical History      Past Medical History:  Diagnosis Date   History of cancer             Patient Active Problem List  Diagnosis   Malignant neoplasm of lower-inner quadrant of left breast in female, estrogen receptor positive (CMS-HCC)   Malignant neoplasm of lower-inner quadrant of left breast in female, estrogen receptor positive (CMS-HCC)    Chronic ulcerative proctitis (CMS-HCC)   Elevated BP without diagnosis of hypertension   Mild intermittent asthma, uncomplicated   Hypercalcemia   Prediabetes   Pure hypercholesterolemia   Stress      Past Surgical History       Past Surgical History:  Procedure Laterality Date   .Breast Biopsy Left      1993   COLON SURGERY N/A      1983   HYSTERECTOMY N/A      "complete hysterectomy" 1983        Allergies  No Known Allergies           Current Outpatient Medications on File Prior to Visit  Medication Sig Dispense Refill   albuterol (VENTOLIN HFA) 90 mcg/actuation inhaler 2 puffs       amLODIPine (NORVASC) 5 MG tablet Take 1 tablet by mouth at bedtime       atorvastatin (LIPITOR) 10 MG tablet Take 1 tablet by mouth once daily       calcium citrate-vitamin D3 (CITRACAL+D) 315 mg-5 mcg (200 unit) tablet Take 1 tablet by mouth once daily       hydroCHLOROthiazide (HYDRODIURIL) 12.5 MG tablet 1 tablet in the morning       mesalamine (LIALDA) 1.2 gram EC tablet Take 1 tablet by mouth once daily        No current facility-administered medications on file prior to visit.      Family History  Family History  Problem Relation Age of Onset   Coronary Artery Disease (Blocked arteries around heart) Father     Breast cancer Maternal Aunt          Social History       Tobacco Use  Smoking Status Never  Smokeless Tobacco Never      Social History  Social History        Socioeconomic History   Marital status: Single  Tobacco Use   Smoking status: Never   Smokeless tobacco: Never  Vaping Use   Vaping Use: Never used  Substance and Sexual Activity   Alcohol use: Never   Drug use: Never        Objective:      Physical Exam    Constitutional:  WDWN in NAD, conversant, no obvious deformities; lying in bed comfortably Eyes:  Pupils equal, round; sclera anicteric; moist conjunctiva; no lid lag HENT:  Oral mucosa moist; good dentition  Neck:  No masses  palpated, trachea midline; no thyromegaly Lungs:  CTA bilaterally; normal respiratory effort Breasts:  symmetric, no nipple changes; no palpable masses or lymphadenopathy on either side CV:  Regular rate and rhythm; no murmurs; extremities well-perfused with no edema Abd:  +bowel sounds, soft, non-tender, no palpable organomegaly; no palpable hernias Musc:  Normal gait; no apparent clubbing or cyanosis in extremities Lymphatic:  No palpable cervical or axillary lymphadenopathy Skin:  Warm, dry; no sign of jaundice Psychiatric - alert and oriented x 4; calm mood and affect     Labs, Imaging and Diagnostic Testing: Diagnosis Breast, left, needle core biopsy, medial - INVASIVE DUCTAL CARCINOMA, WITH TUBULAR FEATURES, SEE NOTE - FOCAL DUCTAL CARCINOMA IN SITU (DCIS), CRIBRIFORM TYPE, GRADE 1 OF 3 - TUBULE FORMATION: SCORE 1 OF 3 - NUCLEAR PLEOMORPHISM: SCORE 1 OF 3 - MITOTIC COUNT: SCORE 1 OF 3 - TOTAL SCORE: 3 OF 9 - OVERALL GRADE: 1 OF 3 - LYMPHOVASCULAR INVASION: NOT IDENTIFIED - CANCER LENGTH: 5 MM IN GREATEST LINEAR DIMENSION ON A FRAGMENTED CORE - CALCIFICATIONS: PRESENT - OTHER FINDINGS: N/A NOTE: DR. Vic Ripper REVIEWED THE CASE AND CONCURS WITH THE INTERPRETATION. A BREAST PROGNOSTIC PROFILE (ER, PR, KI-67 AND HER2) IS PENDING AND WILL BE REPORTED IN AN ADDENDUM. Becky Olson AT [facility] NOTIFIED ON 02/01/2022.     PROGNOSTIC INDICATORS Results: IMMUNOHISTOCHEMICAL AND MORPHOMETRIC ANALYSIS PERFORMED MANUALLY The tumor cells are NEGATIVE for Her2 (1+). Estrogen Receptor: 95%, POSITIVE, STRONG STAINING INTENSITY Progesterone Receptor: 50%, POSITIVE, MODERATE STAINING INTENSITY Proliferation Marker Ki67: 2% REFERENCE RANGE ESTROGEN RECEPTOR NEGATIVE 0% POSITIVE =>1% REFERENCE RANGE PROGESTERONE RECEPTOR NEGATIVE 0% POSITIVE =>1% All controls stained appropriately Casimer Lanius MD Pathologist, Electronic Signature ( Signed 02/02/2022) FINAL     CLINICAL  DATA:  The patient was called back for a left breast asymmetry.   EXAM: DIGITAL DIAGNOSTIC UNILATERAL LEFT MAMMOGRAM WITH TOMOSYNTHESIS AND CAD; ULTRASOUND LEFT BREAST LIMITED   TECHNIQUE: Left digital diagnostic mammography and breast tomosynthesis was performed. The images were evaluated with computer-aided detection.; Targeted ultrasound examination of the left breast was performed.   COMPARISON:  Previous exam(s).   ACR Breast Density Category b: There are scattered areas of fibroglandular density.   FINDINGS: The left breast asymmetry persists on today's imaging. There is suggestion of possible irregularity or spiculation.   On physical exam, no suspicious lumps are identified.   Targeted ultrasound is performed, showing no sonographic correlate for the left breast asymmetry. No axillary adenopathy.   IMPRESSION: Persistent possibly irregular/spiculated subtle left  breast asymmetry located medially.   RECOMMENDATION: Recommend attempted stereotactic biopsy of the left breast asymmetry. If the biopsy cannot be performed due to the subtle nature of the finding, recommend breast MRI.   I have discussed the findings and recommendations with the patient. If applicable, a reminder letter will be sent to the patient regarding the next appointment.   BI-RADS CATEGORY  4: Suspicious.     Electronically Signed   By: Dorise Bullion III M.D.   On: 01/26/2022 10:04     Assessment and Plan:  Diagnoses and all orders for this visit:   Malignant neoplasm of lower-inner quadrant of left breast in female, estrogen receptor positive (CMS-HCC)     I spent a considerable amount of time conferring with oncology as well as radiation oncology, along with physical therapy and genetics.  The patient and her daughter discussing surgical options.  After explaining all of the options, we will proceed with a left radioactive seed localized lumpectomy.  This appears to be a relatively low  risk tumor so do not feel that a sentinel lymph node biopsy is strongly indicated.  She will likely follow surgery with radiation therapy followed by antiestrogens.   Left breast radioactive seed localized lumpectomy.The surgical procedure has been discussed with the patient.  Potential risks, benefits, alternative treatments, and expected outcomes have been explained.  All of the patient's questions at this time have been answered.  The likelihood of reaching the patient's treatment goal is good.  The patient understand the proposed surgical procedure and wishes to proceed.   Carlean Jews, MD  02/08/2022 11:49 AM

## 2022-02-08 NOTE — Research (Signed)
[employer] 2021-05 - Specimen Collection Study to Evaluate Biomarkers in Subjects with Cancer   This Nurse has reviewed this patient's inclusion and exclusion criteria as a second review and confirms Tashanda Fuhrer [place] is eligible for study participation.  Patient may continue with enrollment.  Marjie Skiff Payzlee Ryder, RN, BSN, [facility]  Her  [facility]ers Clinical Research Nurse [facility] Direct Dial [phone]  Pager [phone] 02/08/2022 11:04 AM

## 2022-02-08 NOTE — Assessment & Plan Note (Addendum)
This is a pleasant 71 year old postmenopausal female patient with newly diagnosed left breast screen detected asymmetry.  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  Given small size and favorable pathology findings, we agree with upfront surgery followed by review of final pathology.  At this time we have discussed about antiestrogen therapy.  We have discussed options for antiestrogen therapy today  With regards to Tamoxifen, we discussed that this is a SERM, selective estrogen receptor modulator. We discussed mechanism of action of Tamoxifen, adverse effects on Tamoxifen including but not limited to post menopausal symptoms, increased risk of DVT/PE, increased risk of endometrial cancer, questionable cataracts with long term use and increased risk of cardiovascular events in the study which was not statistically significant. A benefit from Tamoxifen would be improvement in bone density.  With regards to aromatase inhibitors, we discussed mechanism of action, adverse effects including but not limited to post menopausal symptoms, arthralgias, myalgias, increased risk of cardiovascular events and bone loss.  Tamoxifen can be used in premenopausal and post menopausal women. Aromatase inhibitors can only be used in premenopausal women along with OFS.  Last bone density in 2020 showed a T score of -4.3 consistent with osteoporosis.  She currently continues on Fosamax.  We will repeat another bone density. She will return to clinic after surgery to review final pathology and to discuss any additional recommendations.  At this time she is leaning towards tamoxifen as her choice of antiestrogen therapy.  Thank you for consulting [place] in the care of this patient.  Please not hesitate to  contact us with any additional questions or concerns.

## 2022-02-08 NOTE — H&P (View-Only) (Signed)
Subjective    Chief Complaint: Breast Cancer   Breast [place] 02/08/22 - [place] [place]   History of Present Illness: Becky Olson is a 70 y.o. female who is seen today as an office consultation at the request of Dr. Felipa Eth for evaluation of Breast Cancer .     This is a 71 year old female who presents with a recent routine screening mammogram that revealed some asymmetry in the left lower inner quadrant.  There was no sonographic correlate.  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  The tumor is not estimated to measure 7 mm in diameter.  She has had a previous biopsy of the left breast at age 43 for calcifications.  Biopsy was benign.  She has had multiple abdominal surgeries.  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  No blood thinners.  Family history of breast cancer both a maternal as well as a paternal aunt.     Review of Systems: A complete review of systems was obtained from the patient.  I have reviewed this information and discussed as appropriate with the patient.  See HPI as well for other ROS.   Review of Systems  Constitutional: Negative.   HENT: Negative.    Eyes:  Positive for blurred vision.  Respiratory: Negative.    Cardiovascular: Negative.   Gastrointestinal: Negative.   Genitourinary: Negative.   Musculoskeletal:  Positive for joint pain.  Skin: Negative.   Neurological: Negative.   Endo/Heme/Allergies: Negative.   Psychiatric/Behavioral: Negative.         Medical History: Past Medical History      Past Medical History:  Diagnosis Date   History of cancer             Patient Active Problem List  Diagnosis   Malignant neoplasm of lower-inner quadrant of left breast in female, estrogen receptor positive (CMS-HCC)   Malignant neoplasm of lower-inner quadrant of left breast in female, estrogen receptor positive (CMS-HCC)    Chronic ulcerative proctitis (CMS-HCC)   Elevated BP without diagnosis of hypertension   Mild intermittent asthma, uncomplicated   Hypercalcemia   Prediabetes   Pure hypercholesterolemia   Stress      Past Surgical History       Past Surgical History:  Procedure Laterality Date   .Breast Biopsy Left      1993   COLON SURGERY N/A      1983   HYSTERECTOMY N/A      "complete hysterectomy" 1983        Allergies  No Known Allergies           Current Outpatient Medications on File Prior to Visit  Medication Sig Dispense Refill   albuterol (VENTOLIN HFA) 90 mcg/actuation inhaler 2 puffs       amLODIPine (NORVASC) 5 MG tablet Take 1 tablet by mouth at bedtime       atorvastatin (LIPITOR) 10 MG tablet Take 1 tablet by mouth once daily       calcium citrate-vitamin D3 (CITRACAL+D) 315 mg-5 mcg (200 unit) tablet Take 1 tablet by mouth once daily       hydroCHLOROthiazide (HYDRODIURIL) 12.5 MG tablet 1 tablet in the morning       mesalamine (LIALDA) 1.2 gram EC tablet Take 1 tablet by mouth once daily        No current facility-administered medications on file prior to visit.      Family History  Family History  Problem Relation Age of Onset   Coronary Artery Disease (Blocked arteries around heart) Father     Breast cancer Maternal Aunt          Social History       Tobacco Use  Smoking Status Never  Smokeless Tobacco Never      Social History  Social History        Socioeconomic History   Marital status: Single  Tobacco Use   Smoking status: Never   Smokeless tobacco: Never  Vaping Use   Vaping Use: Never used  Substance and Sexual Activity   Alcohol use: Never   Drug use: Never        Objective:      Physical Exam    Constitutional:  WDWN in NAD, conversant, no obvious deformities; lying in bed comfortably Eyes:  Pupils equal, round; sclera anicteric; moist conjunctiva; no lid lag HENT:  Oral mucosa moist; good dentition  Neck:  No masses  palpated, trachea midline; no thyromegaly Lungs:  CTA bilaterally; normal respiratory effort Breasts:  symmetric, no nipple changes; no palpable masses or lymphadenopathy on either side CV:  Regular rate and rhythm; no murmurs; extremities well-perfused with no edema Abd:  +bowel sounds, soft, non-tender, no palpable organomegaly; no palpable hernias Musc:  Normal gait; no apparent clubbing or cyanosis in extremities Lymphatic:  No palpable cervical or axillary lymphadenopathy Skin:  Warm, dry; no sign of jaundice Psychiatric - alert and oriented x 4; calm mood and affect     Labs, Imaging and Diagnostic Testing: Diagnosis Breast, left, needle core biopsy, medial - INVASIVE DUCTAL CARCINOMA, WITH TUBULAR FEATURES, SEE NOTE - FOCAL DUCTAL CARCINOMA IN SITU (DCIS), CRIBRIFORM TYPE, GRADE 1 OF 3 - TUBULE FORMATION: SCORE 1 OF 3 - NUCLEAR PLEOMORPHISM: SCORE 1 OF 3 - MITOTIC COUNT: SCORE 1 OF 3 - TOTAL SCORE: 3 OF 9 - OVERALL GRADE: 1 OF 3 - LYMPHOVASCULAR INVASION: NOT IDENTIFIED - CANCER LENGTH: 5 MM IN GREATEST LINEAR DIMENSION ON A FRAGMENTED CORE - CALCIFICATIONS: PRESENT - OTHER FINDINGS: N/A NOTE: DR. Vic Ripper REVIEWED THE CASE AND CONCURS WITH THE INTERPRETATION. A BREAST PROGNOSTIC PROFILE (ER, PR, KI-67 AND HER2) IS PENDING AND WILL BE REPORTED IN AN ADDENDUM. Becky Olson AT [facility] NOTIFIED ON 02/01/2022.     PROGNOSTIC INDICATORS Results: IMMUNOHISTOCHEMICAL AND MORPHOMETRIC ANALYSIS PERFORMED MANUALLY The tumor cells are NEGATIVE for Her2 (1+). Estrogen Receptor: 95%, POSITIVE, STRONG STAINING INTENSITY Progesterone Receptor: 50%, POSITIVE, MODERATE STAINING INTENSITY Proliferation Marker Ki67: 2% REFERENCE RANGE ESTROGEN RECEPTOR NEGATIVE 0% POSITIVE =>1% REFERENCE RANGE PROGESTERONE RECEPTOR NEGATIVE 0% POSITIVE =>1% All controls stained appropriately Casimer Lanius MD Pathologist, Electronic Signature ( Signed 02/02/2022) FINAL     CLINICAL  DATA:  The patient was called back for a left breast asymmetry.   EXAM: DIGITAL DIAGNOSTIC UNILATERAL LEFT MAMMOGRAM WITH TOMOSYNTHESIS AND CAD; ULTRASOUND LEFT BREAST LIMITED   TECHNIQUE: Left digital diagnostic mammography and breast tomosynthesis was performed. The images were evaluated with computer-aided detection.; Targeted ultrasound examination of the left breast was performed.   COMPARISON:  Previous exam(s).   ACR Breast Density Category b: There are scattered areas of fibroglandular density.   FINDINGS: The left breast asymmetry persists on today's imaging. There is suggestion of possible irregularity or spiculation.   On physical exam, no suspicious lumps are identified.   Targeted ultrasound is performed, showing no sonographic correlate for the left breast asymmetry. No axillary adenopathy.   IMPRESSION: Persistent possibly irregular/spiculated subtle left  breast asymmetry located medially.   RECOMMENDATION: Recommend attempted stereotactic biopsy of the left breast asymmetry. If the biopsy cannot be performed due to the subtle nature of the finding, recommend breast MRI.   I have discussed the findings and recommendations with the patient. If applicable, a reminder letter will be sent to the patient regarding the next appointment.   BI-RADS CATEGORY  4: Suspicious.     Electronically Signed   By: Dorise Bullion III M.D.   On: 01/26/2022 10:04     Assessment and Plan:  Diagnoses and all orders for this visit:   Malignant neoplasm of lower-inner quadrant of left breast in female, estrogen receptor positive (CMS-HCC)     I spent a considerable amount of time conferring with oncology as well as radiation oncology, along with physical therapy and genetics.  The patient and her daughter discussing surgical options.  After explaining all of the options, we will proceed with a left radioactive seed localized lumpectomy.  This appears to be a relatively low  risk tumor so do not feel that a sentinel lymph node biopsy is strongly indicated.  She will likely follow surgery with radiation therapy followed by antiestrogens.   Left breast radioactive seed localized lumpectomy.The surgical procedure has been discussed with the patient.  Potential risks, benefits, alternative treatments, and expected outcomes have been explained.  All of the patient's questions at this time have been answered.  The likelihood of reaching the patient's treatment goal is good.  The patient understand the proposed surgical procedure and wishes to proceed.   Carlean Jews, MD  02/08/2022 11:49 AM

## 2022-02-08 NOTE — Progress Notes (Signed)
[place] Work  Initial Assessment   Becky Olson is a 71 y.o. year old female accompanied by daughter, Vicente Males. Clinical Social Work was referred by  [facility]  for assessment of psychosocial needs.   SDOH (Social Determinants of Health) assessments performed: Yes SDOH Interventions    Flowsheet Row Most Recent Value  SDOH Interventions   Food Insecurity Interventions Intervention Not Indicated  Financial Strain Interventions Intervention Not Indicated  Housing Interventions Intervention Not Indicated  Stress Interventions Intervention Not Indicated  Transportation Interventions Intervention Not Indicated       SDOH Screenings   Alcohol Screen: Not on file  Depression (BSW9-6): Not on file  Financial Resource Strain: Low Risk  (02/08/2022)   Overall Financial Resource Strain (CARDIA)    Difficulty of Paying Living Expenses: Not very hard  Food Insecurity: No Food Insecurity (02/08/2022)   Hunger Vital Sign    Worried About Running Out of Food in the Last Year: Never true    Ran Out of Food in the Last Year: Never true  Housing: Low Risk  (02/08/2022)   Housing    Last Housing Risk Score: 0  Physical Activity: Not on file  Social Connections: Not on file  Stress: Not on file  Tobacco Use: Low Risk  (02/08/2022)   Patient History    Smoking Tobacco Use: Never    Smokeless Tobacco Use: Never    Passive Exposure: Not on file  Transportation Needs: No Transportation Needs (02/08/2022)   PRAPARE - Transportation    Lack of Transportation (Medical): No    Lack of Transportation (Non-Medical): No     Distress Screen completed: Yes    02/08/2022   12:32 PM  ONCBCN DISTRESS SCREENING  Screening Type Initial Screening  Distress experienced in past week (1-10) 1      Family/Social Information:  Housing Arrangement: patient lives alone. Daughter lives close by [facility] members/support persons in your life? Family and Friends Transportation concerns: no  Employment:  Retired .  Income source: Paediatric nurse concerns: No Type of concern: None Food access concerns: no Religious or spiritual practice: Carter Kitten is important to her Services Currently in place:  n/a  Coping/ Adjustment to diagnosis: Patient understands treatment plan and what happens next? yes Concerns about diagnosis and/or treatment: I'm not especially worried about anything Patient enjoys time with family/ friends Current coping skills/ strengths: Capable of independent living , Motivation for treatment/growth , and Supportive family/friends     SUMMARY: Current SDOH Barriers:  No significant SDOH barriers noted today  Clinical Social Work Clinical Goal(s):  No clinical social work goals at this time  Interventions: Discussed common feeling and emotions when being diagnosed with cancer, and the importance of support during treatment Informed patient of the support team roles and support services at [facility] Provided [place] contact information and encouraged patient to call with any questions or concerns   Follow Up Plan: Patient will contact CSW with any support or resource needs Patient verbalizes understanding of plan: Yes    Hallel Denherder E Shakeema Lippman, LCSW

## 2022-02-08 NOTE — Research (Addendum)
[employer] 2021-05 - Specimen Collection Study to Evaluate Biomarkers in Subjects with Cancer    02/08/2022  ELIGIBILITY:  This Coordinator has reviewed this patient's inclusion and exclusion criteria and confirmed Becky Olson is eligible for study participation.  Patient will continue with enrollment.  Eligibility confirmed by research nurse and treating investigator, who also agrees that patient should proceed with enrollment.   CONSENT:  Patient Becky Olson was identified by Dr. Chryl Heck as a potential candidate for the above listed study.  This Clinical Research Coordinator met with [name], [id number], on 02/08/22 in a manner and location that ensures patient privacy to discuss participation in the above listed research study.  Patient is Accompanied by her daughter .  A copy of the informed consent document with embedded HIPAA language was provided to the patient.  Patient reads, speaks, and understands [place].    Patient was provided with the business card of this Coordinator and encouraged to contact the research team with any questions.  Patient was provided the option of taking informed consent documents home to review and was encouraged to review at their convenience with their support network, including other care providers. Patient is comfortable with making a decision regarding study participation today.  As outlined in the informed consent form, this Coordinator and [name] discussed the purpose of the research study, the investigational nature of the study, study procedures and requirements for study participation, potential risks and benefits of study participation, as well as alternatives to participation. This study is not blinded. The patient understands participation is voluntary and they may withdraw from study participation at any time.  This study does not involve randomization.  This study does not involve an investigational drug or device. This study  does not involve a placebo. Patient understands enrollment is pending full eligibility review.   Confidentiality and how the patient's information will be used as part of study participation were discussed.  Patient was informed there is reimbursement provided for their time and effort spent on trial participation.  The patient is encouraged to discuss research study participation with their insurance provider to determine what costs they may incur as part of study participation, including research related injury.    All questions were answered to patient's satisfaction.  The informed consent with embedded HIPAA language was reviewed page by page.  The patient's mental and emotional status is appropriate to provide informed consent, and the patient verbalizes an understanding of study participation.  Patient has agreed to participate in the above listed research study and has voluntarily signed the informed consent dated 29 Aug 2021 version with embedded HIPAA language, version dated 13 Aug 2020  on 02/08/22 at 1053AM.  The patient was provided with a copy of the signed informed consent form with embedded HIPAA language for their reference.  No study specific procedures were obtained prior to the signing of the informed consent document.  Approximately 15 minutes were spent with the patient reviewing the informed consent documents.  Patient was not requested to complete a Release of Information form.   After completion of consent documents, patient's medical history was obtained per patient as follows:  Medical History:  High Blood Pressure  No Coronary Artery Disease No Lupus    No Rheumatoid Arthritis  No Diabetes   No      Lynch Syndrome  No  Is the patient currently taking a magnesium supplement?   No  Does the patient have a personal history of cancer (  greater than 5 years ago)?  No  Does the patient have a family history of cancer in 1st or 2nd degree relatives? Yes If yes, Relationship(s)  and Cancer type(s)?  Maternal aunt: breast Paternal aunt: breast Father: stomach 2 brothers: prostate   Does the patient have history of alcohol consumption? No    Does the patient have history of cigarette, cigar, pipe, or chewing tobacco use?  No   After consent and medical history were obtained, patient and her daughter were escorted to the lab for blood collection: Blood Collection: Research blood obtained by  Fresh venipuncture per patient's preference. Patient tolerated well without any adverse events. Gift Card: $50 gift card given to patient for her participation in this study.

## 2022-02-08 NOTE — Progress Notes (Signed)
[facility] CONSULT NOTE  Patient Care Team: Lajean Manes, MD as PCP - General (Internal Medicine) Mauro Kaufmann, RN as Oncology Nurse Navigator Rockwell Germany, RN as Oncology Nurse Navigator  CHIEF COMPLAINTS/PURPOSE OF CONSULTATION:  Newly diagnosed breast cancer  HISTORY OF PRESENTING ILLNESS:  Becky Olson 71 y.o. female is here because of recent diagnosis of left breast cancer  I reviewed her records extensively and collaborated the history with the patient.  SUMMARY OF ONCOLOGIC HISTORY: Oncology History  Malignant neoplasm of lower-inner quadrant of left breast in female, estrogen receptor positive ([place])  01/09/2022 Mammogram   Screening mammogram detected possible asymmetry in the left breast.  Diagnostic mammogram and ultrasound showed persistent possibly irregular/spiculated subtle left breast asymmetry located medially.  There was no ultrasound correlate hence a stereotactic biopsy was recommended.   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  Per verbal report at breast [facility] today prognostics showed ER 95% strong PR 50% moderate, Ki-67 of 2% and HER2 negative   02/06/2022 Initial Diagnosis   Malignant neoplasm of lower-inner quadrant of left breast in female, estrogen receptor positive ([place])    Patient is here for an initial visit at the breast [facility] with her daughter.  She is a healthy 25 year old with no underlying major medical comorbidities except for hypertension and osteoporosis.  Her blood pressure is well controlled with the medications.  She also takes Fosamax weekly for osteoporosis.  We will she has had normal mammograms in the past.  Family history of breast cancer in maternal aunt.  She has used birth control for about 4 years in the past.  She has 1 child who is 73 years old. Rest of the pertinent 10 point ROS reviewed and  negative  MEDICAL HISTORY:  Past Medical History:  Diagnosis Date   Asthma    Prediabetes    Prehypertension     SURGICAL HISTORY: Past Surgical History:  Procedure Laterality Date   APPENDECTOMY  1985   BREAST BIOPSY Left 1985   BREAST EXCISIONAL BIOPSY Left 1985   BREAST SURGERY  1995   COLON SURGERY  1984   GALLBLADDER SURGERY  1984   VESICOVAGINAL FISTULA CLOSURE W/ TAH  1985    SOCIAL HISTORY: Social History   Socioeconomic History   Marital status: Legally Separated    Spouse name: Not on file   Number of children: 1   Years of education: Not on file   Highest education level: Not on file  Occupational History   Occupation: Hydrologist: [employer]  Tobacco Use   Smoking status: Never   Smokeless tobacco: Never  Vaping Use   Vaping Use: Never used  Substance and Sexual Activity   Alcohol use: No   Drug use: No   Sexual activity: Not on file  Other Topics Concern   Not on file  Social History Narrative   Not on file   Social Determinants of Health   Financial Resource Strain: Not on file  Food Insecurity: Not on file  Transportation Needs: Not on file  Physical Activity: Not on file  Stress: Not on file  Social Connections: Not on file  Intimate Partner Violence: Not on file    FAMILY HISTORY: Family History  Problem Relation Age of Onset   Cancer Father        abdominal   Prostate cancer Father  Prostate cancer Brother    Prostate cancer Brother    Breast cancer Maternal Aunt        80s   Breast cancer Paternal Aunt     ALLERGIES:  has no allergies on file.  MEDICATIONS:  Current Outpatient Medications  Medication Sig Dispense Refill   albuterol (VENTOLIN HFA) 108 (90 Base) MCG/ACT inhaler INHALE 2 PUFFS EVERY 4 HOURS AS DIRECTED - RESCUE INHALER 8.5 g PRN   alendronate (FOSAMAX) 70 MG tablet Take 70 mg by mouth once a week.     amLODipine (NORVASC) 2.5 MG tablet Take 2.5 mg by mouth daily.     atorvastatin  (LIPITOR) 10 MG tablet Take 10 mg by mouth daily.     calcium citrate-vitamin D (CITRACAL+D) 315-200 MG-UNIT per tablet Take 1 tablet by mouth daily.     cholecalciferol (VITAMIN D) 25 MCG (1000 UNIT) tablet 1 capsule     hydrochlorothiazide (MICROZIDE) 12.5 MG capsule Take 12.5 mg by mouth daily.     mesalamine (LIALDA) 1.2 g EC tablet      No current facility-administered medications for this visit.    REVIEW OF SYSTEMS:   Constitutional: Denies fevers, chills or abnormal night sweats Eyes: Denies blurriness of vision, double vision or watery eyes Ears, nose, mouth, throat, and face: Denies mucositis or sore throat Respiratory: Denies cough, dyspnea or wheezes Cardiovascular: Denies palpitation, chest discomfort or lower extremity swelling Gastrointestinal:  Denies nausea, heartburn or change in bowel habits Skin: Denies abnormal skin rashes Lymphatics: Denies new lymphadenopathy or easy bruising Neurological:Denies numbness, tingling or new weaknesses Behavioral/Psych: Mood is stable, no new changes  Breast: Denies any palpable lumps or discharge All other systems were reviewed with the patient and are negative.  PHYSICAL EXAMINATION: ECOG PERFORMANCE STATUS: 0 - Asymptomatic  Vitals:   02/08/22 0846  BP: (!) 177/64  Pulse: 94  Resp: 18  Temp: 98 F (36.7 C)  SpO2: 98%   Filed Weights   02/08/22 0846  Weight: 156 lb (70.8 kg)    GENERAL:alert, no distress and comfortable SKIN: skin color, texture, turgor are normal, no rashes or significant lesions EYES: normal, conjunctiva are pink and non-injected, sclera clear OROPHARYNX:no exudate, no erythema and lips, buccal mucosa, and tongue normal  NECK: supple, thyroid normal size, non-tender, without nodularity LYMPH:  no palpable lymphadenopathy in the cervical, axillary or inguinal LUNGS: clear to auscultation and percussion with normal breathing effort HEART: regular rate & rhythm and no murmurs and no lower extremity  edema ABDOMEN:abdomen soft, non-tender and normal bowel sounds Musculoskeletal:no cyanosis of digits and no clubbing  PSYCH: alert & oriented x 3 with fluent speech NEURO: no focal motor/sensory deficits BREAST: Biopsy site noted.  No palpable nodules in the breast.  No regional adenopathy.  LABORATORY DATA:  I have reviewed the data as listed Lab Results  Component Value Date   WBC 7.7 02/08/2022   HGB 13.7 02/08/2022   HCT 41.5 02/08/2022   MCV 89.4 02/08/2022   PLT 218 02/08/2022   Lab Results  Component Value Date   NA 141 02/08/2022   K 4.1 02/08/2022   CL 103 02/08/2022   CO2 32 02/08/2022    RADIOGRAPHIC STUDIES: I have personally reviewed the radiological reports and agreed with the findings in the report.  ASSESSMENT AND PLAN:  Malignant neoplasm of lower-inner quadrant of left breast in female, estrogen receptor positive ([place]) This is a pleasant 71 year old postmenopausal female patient with newly diagnosed left breast screen detected asymmetry.  Further  investigation showed a irregular/spiculated subtle left breast asymmetry, stereotactic guided biopsy confirmed IDC with tubular features, focal DCIS, cribriform type, grade 1 out of 3, prognostic showed ER 95% strong PR 50% moderate, Ki-67 of 2% and HER2 negative.  Given small size and favorable pathology findings, we agree with upfront surgery followed by review of final pathology.  At this time we have discussed about antiestrogen therapy.  We have discussed options for antiestrogen therapy today  With regards to Tamoxifen, we discussed that this is a SERM, selective estrogen receptor modulator. We discussed mechanism of action of Tamoxifen, adverse effects on Tamoxifen including but not limited to post menopausal symptoms, increased risk of DVT/PE, increased risk of endometrial cancer, questionable cataracts with long term use and increased risk of cardiovascular events in the study which was not statistically  significant. A benefit from Tamoxifen would be improvement in bone density.  With regards to aromatase inhibitors, we discussed mechanism of action, adverse effects including but not limited to post menopausal symptoms, arthralgias, myalgias, increased risk of cardiovascular events and bone loss.  Tamoxifen can be used in premenopausal and post menopausal women. Aromatase inhibitors can only be used in premenopausal women along with OFS.  Last bone density in 2020 showed a T score of -4.3 consistent with osteoporosis.  She currently continues on Fosamax.  We will repeat another bone density. She will return to clinic after surgery to review final pathology and to discuss any additional recommendations.  At this time she is leaning towards tamoxifen as her choice of antiestrogen therapy.  Thank you for consulting [place] in the care of this patient.  Please not hesitate to contact us with any additional questions or concerns.    Total time spent: 60 minutes including history, physical exam, review of records, counseling and coordination of care All questions were answered. The patient knows to call the clinic with any problems, questions or concerns.    Benay Pike, MD 02/08/22

## 2022-02-09 ENCOUNTER — Encounter: Payer: Self-pay | Admitting: Genetic Counselor

## 2022-02-09 DIAGNOSIS — Z803 Family history of malignant neoplasm of breast: Secondary | ICD-10-CM

## 2022-02-09 DIAGNOSIS — Z8042 Family history of malignant neoplasm of prostate: Secondary | ICD-10-CM | POA: Insufficient documentation

## 2022-02-09 HISTORY — DX: Family history of malignant neoplasm of prostate: Z80.42

## 2022-02-09 HISTORY — DX: Family history of malignant neoplasm of breast: Z80.3

## 2022-02-09 NOTE — Progress Notes (Signed)
REFERRING PROVIDER: Benay Pike, MD [place],  [place] [address]  PRIMARY PROVIDER:  Lajean Manes, MD  PRIMARY REASON FOR VISIT:  1. Malignant neoplasm of lower-inner quadrant of left breast in female, estrogen receptor positive ([place])   2. Family history of breast cancer   3. Family history of prostate cancer     HISTORY OF PRESENT ILLNESS:   Becky Olson, a 71 y.o. female, was seen for a [facility] cancer genetics consultation during the breast multidisciplinary clinic at the request of Dr. Chryl Heck due to a personal and family history of cancer.  Becky Olson presents to clinic today to discuss the possibility of a hereditary predisposition to cancer, to discuss genetic testing, and to further clarify her future cancer risks, as well as potential cancer risks for family members.   In July 2023, at the age of 32, Becky Olson was diagnosed with invasive ductal carcinoma left breast (ER+/PR+/HER2-). The treatment plan is pending.   CANCER HISTORY:  Oncology History  Malignant neoplasm of lower-inner quadrant of left breast in female, estrogen receptor positive ([place])  01/09/2022 Mammogram   Screening mammogram detected possible asymmetry in the left breast.  Diagnostic mammogram and ultrasound showed persistent possibly irregular/spiculated subtle left breast asymmetry located medially.  There was no ultrasound correlate hence a stereotactic biopsy was recommended.   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  Per verbal report at breast [facility] today prognostics showed ER 95% strong PR 50% moderate, Ki-67 of 2% and HER2 negative   02/06/2022 Initial Diagnosis   Malignant neoplasm of lower-inner quadrant of left breast in female, estrogen receptor positive ([place])      RISK FACTORS:  Colonoscopy: yes;  most recent in 2021 . Hysterectomy: yes.  Menarche was at age 69.   First live birth at age 55.  OCP use for approximately 5 years.  HRT use: 0 years.  Past Medical History:  Diagnosis Date   Asthma    Family history of breast cancer 02/09/2022   Family history of prostate cancer 02/09/2022   Prediabetes    Prehypertension     Past Surgical History:  Procedure Laterality Date   APPENDECTOMY  1985   BREAST BIOPSY Left 1985   BREAST EXCISIONAL BIOPSY Left [place]   VESICOVAGINAL FISTULA CLOSURE W/ TAH  1985     FAMILY HISTORY:  We obtained a detailed, 4-generation family history.  Significant diagnoses are listed below: Family History  Problem Relation Age of Onset   Stomach cancer Father 65   Prostate cancer Father        dx after 28   Prostate cancer Brother        dx after 7   Prostate cancer Brother        dx after 15   Breast cancer Maternal Aunt        80s   Breast cancer Paternal Aunt        dx after 68    Becky Olson is unaware of previous family history of genetic testing for hereditary cancer risks. There is no reported Ashkenazi Jewish ancestry. There is no known consanguinity.  GENETIC COUNSELING ASSESSMENT: Becky Olson is a 71 y.o. female with a personal and family history of cancer which is somewhat suggestive of a hereditary cancer syndrome and  predisposition to cancer given the presence of related cancers in the family (breast and prostate cancers). We, therefore, discussed and recommended the following at today's visit.   DISCUSSION: We discussed that 5 - 10% of cancer is hereditary, with most cases of hereditary breast cancer associated with mutations in BRCA1/2.  There are other genes that can be associated with hereditary breast and prostate cancer syndromes.  Type of cancer risk and level of risk are gene-specific. We discussed that testing is beneficial for several reasons including knowing how to follow individuals after completing their  treatment, identifying whether potential treatment options would be beneficial, and understanding if other family members could be at risk for cancer and allowing them to undergo genetic testing.   We reviewed the characteristics, features and inheritance patterns of hereditary cancer syndromes. We also discussed genetic testing, including the appropriate family members to test, the process of testing, insurance coverage and turn-around-time for results. We discussed the implications of a negative, positive and/or variant of uncertain significant result. In order to get genetic test results in a timely manner so that Becky Olson can use these genetic test results for surgical decisions, we recommended Becky Olson pursue genetic testing for the [employer].  The BRCAplus panel offered by [employer] and includes sequencing and deletion/duplication analysis for the following 8 genes: ATM, BRCA1, BRCA2, CDH1, CHEK2, PALB2, PTEN, and TP53.Once complete, we recommend Becky Olson pursue reflex genetic testing to a more comprehensive gene panel.   The CancerNext-Expanded gene panel offered by [facility] and includes sequencing, rearrangement, and RNA analysis for the following 77 genes: AIP, ALK, APC, ATM, AXIN2, BAP1, BARD1, BLM, BMPR1A, BRCA1, BRCA2, BRIP1, CDC73, CDH1, CDK4, CDKN1B, CDKN2A, CHEK2, CTNNA1, DICER1, FANCC, FH, FLCN, GALNT12, KIF1B, LZTR1, MAX, MEN1, MET, MLH1, MSH2, MSH3, MSH6, MUTYH, NBN, NF1, NF2, NTHL1, PALB2, PHOX2B, PMS2, POT1, PRKAR1A, PTCH1, PTEN, RAD51C, RAD51D, RB1, RECQL, RET, SDHA, SDHAF2, SDHB, SDHC, SDHD, SMAD4, SMARCA4, SMARCB1, SMARCE1, STK11, SUFU, TMEM127, TP53, TSC1, TSC2, VHL and XRCC2 (sequencing and deletion/duplication); EGFR, EGLN1, HOXB13, KIT, MITF, PDGFRA, POLD1, and POLE (sequencing only); EPCAM and GREM1 (deletion/duplication only).   Based on Becky Olson's personal and family history of cancer, she meets medical criteria for genetic testing. Despite that  she meets criteria, she may still have an out of pocket cost. We discussed that if her out of pocket cost for testing is over $100, the laboratory should contact them to discuss self-pay prices, patient pay assistance programs, if applicable, and other billing options.   PLAN: After considering the risks, benefits, and limitations, Becky Olson provided informed consent to pursue genetic testing and the blood sample was sent to [facility] for analysis of the [facility] and CancerNext-Expanded +RNAinsight Panel. Results should be available within approximately 1-2 weeks' time, at which point they will be disclosed by telephone to Becky Olson's daughter, Vicente Males (per Becky Olson's request), as will any additional recommendations warranted by these results. Becky Olson will receive a summary of her genetic counseling visit and a copy of her results once available. This information will also be available in Epic.   Becky Olson questions were answered to her satisfaction today. Our contact information was provided should additional questions or concerns arise. Thank you for the referral and allowing [place] to share in the care of your patient.   Becky Olson M. Joette Catching, [place], [facility] Genetic Counselor [email or web address] (P) [phone]  The patient was seen for a total of 15 minutes in face-to-face.  Patient was accompanied by her daughter,  Anna.  Drs.  Lindi Adie and/or Burr Medico were available to discuss this case as needed.  _______________________________________________________________________ For Office Staff:  Number of people involved in session: 2 Was an Intern/ student involved with case: no

## 2022-02-13 ENCOUNTER — Other Ambulatory Visit: Payer: Self-pay | Admitting: *Deleted

## 2022-02-13 ENCOUNTER — Other Ambulatory Visit: Payer: Self-pay | Admitting: Surgery

## 2022-02-13 DIAGNOSIS — C50312 Malignant neoplasm of lower-inner quadrant of left female breast: Secondary | ICD-10-CM

## 2022-02-14 ENCOUNTER — Telehealth: Payer: Self-pay | Admitting: Hematology and Oncology

## 2022-02-14 NOTE — Telephone Encounter (Signed)
.  Called patient to schedule appointment per 7/17 inbasket, patient is aware of date and time.   

## 2022-02-15 ENCOUNTER — Encounter (HOSPITAL_BASED_OUTPATIENT_CLINIC_OR_DEPARTMENT_OTHER)
Admission: RE | Admit: 2022-02-15 | Discharge: 2022-02-15 | Disposition: A | Discharge status: Home / Self Care | Payer: Medicare Other | Source: Ambulatory Visit | Attending: Surgery | Admitting: Surgery

## 2022-02-15 ENCOUNTER — Other Ambulatory Visit: Payer: Self-pay

## 2022-02-15 ENCOUNTER — Encounter (HOSPITAL_BASED_OUTPATIENT_CLINIC_OR_DEPARTMENT_OTHER): Payer: Self-pay | Admitting: Surgery

## 2022-02-15 DIAGNOSIS — I10 Essential (primary) hypertension: Secondary | ICD-10-CM | POA: Insufficient documentation

## 2022-02-15 DIAGNOSIS — Z01810 Encounter for preprocedural cardiovascular examination: Secondary | ICD-10-CM | POA: Diagnosis not present

## 2022-02-15 MED ORDER — CHLORHEXIDINE GLUCONATE CLOTH 2 % EX PADS: 6.0000 | MEDICATED_PAD | Freq: Once | CUTANEOUS | Status: DC

## 2022-02-16 ENCOUNTER — Encounter: Payer: Self-pay | Admitting: *Deleted

## 2022-02-16 ENCOUNTER — Telehealth: Payer: Self-pay | Admitting: *Deleted

## 2022-02-16 NOTE — Telephone Encounter (Signed)
Left a VM for a return phone call to follow from [facility] 7/12 and assess navigation needs.

## 2022-02-21 ENCOUNTER — Other Ambulatory Visit: Payer: Self-pay | Admitting: Surgery

## 2022-02-21 ENCOUNTER — Ambulatory Visit
Admission: RE | Admit: 2022-02-21 | Discharge: 2022-02-21 | Disposition: A | Discharge status: Home / Self Care | Payer: Medicare Other | Source: Ambulatory Visit | Attending: Surgery | Admitting: Surgery

## 2022-02-21 DIAGNOSIS — Z170 Estrogen receptor positive status [ER+]: Secondary | ICD-10-CM

## 2022-02-21 DIAGNOSIS — C50912 Malignant neoplasm of unspecified site of left female breast: Secondary | ICD-10-CM | POA: Diagnosis not present

## 2022-02-21 DIAGNOSIS — R928 Other abnormal and inconclusive findings on diagnostic imaging of breast: Secondary | ICD-10-CM | POA: Diagnosis not present

## 2022-02-22 ENCOUNTER — Other Ambulatory Visit: Payer: Self-pay

## 2022-02-22 ENCOUNTER — Ambulatory Visit (HOSPITAL_BASED_OUTPATIENT_CLINIC_OR_DEPARTMENT_OTHER)
Admission: RE | Admit: 2022-02-22 | Discharge: 2022-02-22 | Disposition: A | Discharge status: Home / Self Care | Payer: Medicare Other | Attending: Surgery | Admitting: Surgery

## 2022-02-22 ENCOUNTER — Ambulatory Visit (HOSPITAL_BASED_OUTPATIENT_CLINIC_OR_DEPARTMENT_OTHER): Payer: Medicare Other | Admitting: Certified Registered"

## 2022-02-22 ENCOUNTER — Encounter (HOSPITAL_BASED_OUTPATIENT_CLINIC_OR_DEPARTMENT_OTHER): Payer: Self-pay | Admitting: Surgery

## 2022-02-22 ENCOUNTER — Encounter (HOSPITAL_BASED_OUTPATIENT_CLINIC_OR_DEPARTMENT_OTHER)
Admission: RE | Disposition: A | Discharge status: Home / Self Care | Payer: Self-pay | Source: Home / Self Care | Attending: Surgery

## 2022-02-22 ENCOUNTER — Ambulatory Visit
Admission: RE | Admit: 2022-02-22 | Discharge: 2022-02-22 | Disposition: A | Discharge status: Home / Self Care | Payer: Medicare Other | Source: Ambulatory Visit | Attending: Surgery | Admitting: Surgery

## 2022-02-22 DIAGNOSIS — R928 Other abnormal and inconclusive findings on diagnostic imaging of breast: Secondary | ICD-10-CM | POA: Diagnosis not present

## 2022-02-22 DIAGNOSIS — Z170 Estrogen receptor positive status [ER+]: Secondary | ICD-10-CM | POA: Diagnosis not present

## 2022-02-22 DIAGNOSIS — K6289 Other specified diseases of anus and rectum: Secondary | ICD-10-CM | POA: Insufficient documentation

## 2022-02-22 DIAGNOSIS — C50912 Malignant neoplasm of unspecified site of left female breast: Secondary | ICD-10-CM

## 2022-02-22 DIAGNOSIS — J45909 Unspecified asthma, uncomplicated: Secondary | ICD-10-CM | POA: Insufficient documentation

## 2022-02-22 DIAGNOSIS — Z803 Family history of malignant neoplasm of breast: Secondary | ICD-10-CM | POA: Insufficient documentation

## 2022-02-22 DIAGNOSIS — M1990 Unspecified osteoarthritis, unspecified site: Secondary | ICD-10-CM | POA: Diagnosis not present

## 2022-02-22 DIAGNOSIS — C50312 Malignant neoplasm of lower-inner quadrant of left female breast: Secondary | ICD-10-CM | POA: Diagnosis not present

## 2022-02-22 DIAGNOSIS — I10 Essential (primary) hypertension: Secondary | ICD-10-CM | POA: Insufficient documentation

## 2022-02-22 HISTORY — DX: Ulcerative (chronic) proctitis without complications: K51.20

## 2022-02-22 HISTORY — DX: Essential (primary) hypertension: I10

## 2022-02-22 HISTORY — DX: Prediabetes: R73.03

## 2022-02-22 HISTORY — DX: Unspecified osteoarthritis, unspecified site: M19.90

## 2022-02-22 HISTORY — PX: BREAST LUMPECTOMY WITH RADIOACTIVE SEED LOCALIZATION: SHX6424

## 2022-02-22 SURGERY — BREAST LUMPECTOMY WITH RADIOACTIVE SEED LOCALIZATION
Anesthesia: General | Site: Breast | Laterality: Left

## 2022-02-22 MED ORDER — CEFAZOLIN SODIUM-DEXTROSE 2-4 GM/100ML-% IV SOLN
2.0000 g | INTRAVENOUS | Status: AC
  Administered 2022-02-22: 2 g via INTRAVENOUS

## 2022-02-22 MED ORDER — PROPOFOL 500 MG/50ML IV EMUL
INTRAVENOUS | Status: DC | PRN
  Administered 2022-02-22: 25 ug/kg/min via INTRAVENOUS

## 2022-02-22 MED ORDER — ACETAMINOPHEN 500 MG PO TABS
1000.0000 mg | ORAL_TABLET | ORAL | Status: AC
  Administered 2022-02-22: 1000 mg via ORAL

## 2022-02-22 MED ORDER — ONDANSETRON HCL 4 MG/2ML IJ SOLN
INTRAMUSCULAR | Status: DC | PRN
  Administered 2022-02-22: 4 mg via INTRAVENOUS

## 2022-02-22 MED ORDER — FENTANYL CITRATE (PF) 100 MCG/2ML IJ SOLN
INTRAMUSCULAR | Status: AC
  Filled 2022-02-22: qty 2

## 2022-02-22 MED ORDER — LIDOCAINE HCL (CARDIAC) PF 100 MG/5ML IV SOSY
PREFILLED_SYRINGE | INTRAVENOUS | Status: DC | PRN
  Administered 2022-02-22: 6060 mg via INTRAVENOUS

## 2022-02-22 MED ORDER — CEFAZOLIN SODIUM-DEXTROSE 2-4 GM/100ML-% IV SOLN
INTRAVENOUS | Status: AC
  Filled 2022-02-22: qty 100

## 2022-02-22 MED ORDER — LACTATED RINGERS IV SOLN: INTRAVENOUS | Status: DC

## 2022-02-22 MED ORDER — FENTANYL CITRATE (PF) 100 MCG/2ML IJ SOLN
INTRAMUSCULAR | Status: DC | PRN
  Administered 2022-02-22: 50 ug via INTRAVENOUS
  Administered 2022-02-22 (×2): 25 ug via INTRAVENOUS

## 2022-02-22 MED ORDER — BUPIVACAINE-EPINEPHRINE 0.25% -1:200000 IJ SOLN
INTRAMUSCULAR | Status: DC | PRN
  Administered 2022-02-22: 10 mL

## 2022-02-22 MED ORDER — PROPOFOL 10 MG/ML IV BOLUS
INTRAVENOUS | Status: DC | PRN
  Administered 2022-02-22: 150 mg via INTRAVENOUS

## 2022-02-22 MED ORDER — HYDROMORPHONE HCL 1 MG/ML IJ SOLN: 0.2500 mg | INTRAMUSCULAR | Status: DC | PRN

## 2022-02-22 MED ORDER — ACETAMINOPHEN 500 MG PO TABS
ORAL_TABLET | ORAL | Status: AC
  Filled 2022-02-22: qty 2

## 2022-02-22 SURGICAL SUPPLY — 46 items
APL PRP STRL LF DISP 70% ISPRP (MISCELLANEOUS) ×1
APL SKNCLS STERI-STRIP NONHPOA (GAUZE/BANDAGES/DRESSINGS) ×1
APPLIER CLIP 9.375 MED OPEN (MISCELLANEOUS) ×2
APR CLP MED 9.3 20 MLT OPN (MISCELLANEOUS) ×1
BENZOIN TINCTURE PRP APPL 2/3 (GAUZE/BANDAGES/DRESSINGS) ×2 IMPLANT
BLADE HEX COATED 2.75 (ELECTRODE) ×2 IMPLANT
BLADE SURG 15 STRL LF DISP TIS (BLADE) ×1 IMPLANT
BLADE SURG 15 STRL SS (BLADE) ×2
CANISTER SUC SOCK COL 7IN (MISCELLANEOUS) IMPLANT
CANISTER SUCT 1200ML W/VALVE (MISCELLANEOUS) IMPLANT
CHLORAPREP W/TINT 26 (MISCELLANEOUS) ×2 IMPLANT
CLIP APPLIE 9.375 MED OPEN (MISCELLANEOUS) ×1 IMPLANT
COVER BACK TABLE 60X90IN (DRAPES) ×2 IMPLANT
COVER MAYO STAND STRL (DRAPES) ×2 IMPLANT
COVER PROBE W GEL 5X96 (DRAPES) ×2 IMPLANT
DRAPE LAPAROTOMY 100X72 PEDS (DRAPES) ×2 IMPLANT
DRAPE UTILITY XL STRL (DRAPES) ×2 IMPLANT
DRSG TEGADERM 4X4.75 (GAUZE/BANDAGES/DRESSINGS) ×2 IMPLANT
ELECT REM PT RETURN 9FT ADLT (ELECTROSURGICAL) ×2
ELECTRODE REM PT RTRN 9FT ADLT (ELECTROSURGICAL) ×1 IMPLANT
GAUZE SPONGE 4X4 12PLY STRL LF (GAUZE/BANDAGES/DRESSINGS) ×1 IMPLANT
GLOVE BIO SURGEON STRL SZ7 (GLOVE) ×2 IMPLANT
GLOVE BIOGEL PI IND STRL 7.5 (GLOVE) ×1 IMPLANT
GLOVE BIOGEL PI INDICATOR 7.5 (GLOVE) ×1
GOWN STRL REUS W/ TWL LRG LVL3 (GOWN DISPOSABLE) ×2 IMPLANT
GOWN STRL REUS W/TWL LRG LVL3 (GOWN DISPOSABLE) ×4
KIT MARKER MARGIN INK (KITS) ×2 IMPLANT
NDL HYPO 25X1 1.5 SAFETY (NEEDLE) ×1 IMPLANT
NEEDLE HYPO 25X1 1.5 SAFETY (NEEDLE) ×2 IMPLANT
NS IRRIG 1000ML POUR BTL (IV SOLUTION) ×2 IMPLANT
PACK BASIN DAY SURGERY FS (CUSTOM PROCEDURE TRAY) ×2 IMPLANT
PENCIL SMOKE EVACUATOR (MISCELLANEOUS) ×2 IMPLANT
SLEEVE SCD COMPRESS KNEE MED (STOCKING) ×2 IMPLANT
SPONGE GAUZE 2X2 8PLY STRL LF (GAUZE/BANDAGES/DRESSINGS) IMPLANT
SPONGE T-LAP 4X18 ~~LOC~~+RFID (SPONGE) ×2 IMPLANT
STRIP CLOSURE SKIN 1/2X4 (GAUZE/BANDAGES/DRESSINGS) ×2 IMPLANT
SUT MON AB 4-0 PC3 18 (SUTURE) ×2 IMPLANT
SUT SILK 2 0 SH (SUTURE) IMPLANT
SUT VIC AB 3-0 SH 27 (SUTURE) ×2
SUT VIC AB 3-0 SH 27X BRD (SUTURE) ×1 IMPLANT
SYR BULB EAR ULCER 3OZ GRN STR (SYRINGE) IMPLANT
SYR CONTROL 10ML LL (SYRINGE) ×2 IMPLANT
TOWEL GREEN STERILE FF (TOWEL DISPOSABLE) ×2 IMPLANT
TRAY FAXITRON CT DISP (TRAY / TRAY PROCEDURE) ×2 IMPLANT
TUBE CONNECTING 20X1/4 (TUBING) IMPLANT
YANKAUER SUCT BULB TIP NO VENT (SUCTIONS) IMPLANT

## 2022-02-22 NOTE — Interval H&P Note (Signed)
History and Physical Interval Note:  02/22/2022 1:46 PM  Becky Olson  has presented today for surgery, with the diagnosis of LEFT BREAST INVASIVE DUCTAL CARCINOMA.  The various methods of treatment have been discussed with the patient and family. After consideration of risks, benefits and other options for treatment, the patient has consented to  Procedure(s) with comments: LEFT BREAST LUMPECTOMY WITH RADIOACTIVE SEED LOCALIZATION (Left) - LMA as a surgical intervention.  The patient's history has been reviewed, patient examined, no change in status, stable for surgery.  I have reviewed the patient's chart and labs.  Questions were answered to the patient's satisfaction.     Maia Petties

## 2022-02-22 NOTE — Anesthesia Preprocedure Evaluation (Addendum)
Anesthesia Evaluation  Patient identified by MRN, date of birth, ID band Patient awake    Reviewed: Allergy & Precautions, NPO status , Patient's Chart, lab work & pertinent test results  Airway Mallampati: II  TM Distance: >3 FB     Dental   Pulmonary asthma ,    breath sounds clear to auscultation       Cardiovascular hypertension,  Rhythm:Regular Rate:Normal     Neuro/Psych negative neurological ROS     GI/Hepatic Neg liver ROS, PUD,   Endo/Other  negative endocrine ROS  Renal/GU negative Renal ROS     Musculoskeletal  (+) Arthritis ,   Abdominal   Peds  Hematology   Anesthesia Other Findings   Reproductive/Obstetrics                             Anesthesia Physical Anesthesia Plan  ASA: 3  Anesthesia Plan: General   Post-op Pain Management:    Induction: Intravenous  PONV Risk Score and Plan: 3 and Ondansetron, Dexamethasone and Midazolam  Airway Management Planned: LMA  Additional Equipment:   Intra-op Plan:   Post-operative Plan: Extubation in OR  Informed Consent: I have reviewed the patients History and Physical, chart, labs and discussed the procedure including the risks, benefits and alternatives for the proposed anesthesia with the patient or authorized representative who has indicated his/her understanding and acceptance.     Dental advisory given  Plan Discussed with: CRNA and Anesthesiologist  Anesthesia Plan Comments:         Anesthesia Quick Evaluation

## 2022-02-22 NOTE — Discharge Instructions (Addendum)
[place] Office Phone Number [phone]  BREAST BIOPSY/ PARTIAL MASTECTOMY: POST OP INSTRUCTIONS  Always review your discharge instruction sheet given to you by the facility where your surgery was performed.  IF YOU HAVE DISABILITY OR FAMILY LEAVE FORMS, YOU MUST BRING THEM TO THE OFFICE FOR PROCESSING.  DO NOT GIVE THEM TO YOUR DOCTOR.  A prescription for pain medication may be given to you upon discharge.  Take your pain medication as prescribed, if needed.  If narcotic pain medicine is not needed, then you may take acetaminophen (Tylenol) or ibuprofen (Advil) as needed. Take your usually prescribed medications unless otherwise directed If you need a refill on your pain medication, please contact your pharmacy.  They will contact our office to request authorization.  Prescriptions will not be filled after 5pm or on week-ends. You should eat very light the first 24 hours after surgery, such as soup, crackers, pudding, etc.  Resume your normal diet the day after surgery. Most patients will experience some swelling and bruising in the breast.  Ice packs and a good support bra will help.  Swelling and bruising can take several days to resolve.  It is common to experience some constipation if taking pain medication after surgery.  Increasing fluid intake and taking a stool softener will usually help or prevent this problem from occurring.  A mild laxative (Milk of Magnesia or Miralax) should be taken according to package directions if there are no bowel movements after 48 hours. Unless discharge instructions indicate otherwise, you may remove your bandages 24-48 hours after surgery, and you may shower at that time.  You may have steri-strips (small skin tapes) in place directly over the incision.  These strips should be left on the skin for 7-10 days.  If your surgeon used skin glue on the incision, you may shower in 24 hours.  The glue will flake off over the next 2-3 weeks.  Any  sutures or staples will be removed at the office during your follow-up visit. ACTIVITIES:  You may resume regular daily activities (gradually increasing) beginning the next day.  Wearing a good support bra or sports bra minimizes pain and swelling.  You may have sexual intercourse when it is comfortable. You may drive when you no longer are taking prescription pain medication, you can comfortably wear a seatbelt, and you can safely maneuver your car and apply brakes. RETURN TO WORK:  ______________________________________________________________________________________ Dennis Bast should see your doctor in the office for a follow-up appointment approximately two weeks after your surgery.  Your doctor's nurse will typically make your follow-up appointment when she calls you with your pathology report.  Expect your pathology report 2-3 business days after your surgery.  You may call to check if you do not hear from [place] after three days. OTHER INSTRUCTIONS: _______________________________________________________________________________________________ _____________________________________________________________________________________________________________________________________ _____________________________________________________________________________________________________________________________________ _____________________________________________________________________________________________________________________________________  WHEN TO CALL YOUR DOCTOR: Fever over 101.0 Nausea and/or vomiting. Extreme swelling or bruising. Continued bleeding from incision. Increased pain, redness, or drainage from the incision.  The clinic staff is available to answer your questions during regular business hours.  Please don't hesitate to call and ask to speak to one of the nurses for clinical concerns.  If you have a medical emergency, go to the nearest emergency room or call 911.  A surgeon from [facility] is always on call at the hospital.  For further questions, please visit [email or web address]  No Tylenol uptil 7:30pm   Post Anesthesia Home Care Instructions  Activity: Get plenty of rest  for the remainder of the day. A responsible individual must stay with you for 24 hours following the procedure.  For the next 24 hours, DO NOT: -Drive a car -Paediatric nurse -Drink alcoholic beverages -Take any medication unless instructed by your physician -Make any legal decisions or sign important papers.  Meals: Start with liquid foods such as gelatin or soup. Progress to regular foods as tolerated. Avoid greasy, spicy, heavy foods. If nausea and/or vomiting occur, drink only clear liquids until the nausea and/or vomiting subsides. Call your physician if vomiting continues.  Special Instructions/Symptoms: Your throat may feel dry or sore from the anesthesia or the breathing tube placed in your throat during surgery. If this causes discomfort, gargle with warm salt water. The discomfort should disappear within 24 hours.  If you had a scopolamine patch placed behind your ear for the management of post- operative nausea and/or vomiting:  1. The medication in the patch is effective for 72 hours, after which it should be removed.  Wrap patch in a tissue and discard in the trash. Wash hands thoroughly with soap and water. 2. You may remove the patch earlier than 72 hours if you experience unpleasant side effects which may include dry mouth, dizziness or visual disturbances. 3. Avoid touching the patch. Wash your hands with soap and water after contact with the patch.

## 2022-02-22 NOTE — Anesthesia Procedure Notes (Signed)
Procedure Name: LMA Insertion Date/Time: 02/22/2022 2:20 PM  Performed by: Taygen Newsome, Ernesta Amble, CRNAPre-anesthesia Checklist: Patient identified, Emergency Drugs available, Suction available and Patient being monitored Patient Re-evaluated:Patient Re-evaluated prior to induction Oxygen Delivery Method: Circle system utilized Preoxygenation: Pre-oxygenation with 100% oxygen Induction Type: IV induction Ventilation: Mask ventilation without difficulty LMA: LMA inserted LMA Size: 4.0 Number of attempts: 1 Airway Equipment and Method: Bite block Placement Confirmation: positive ETCO2 Tube secured with: Tape Dental Injury: Teeth and Oropharynx as per pre-operative assessment

## 2022-02-22 NOTE — Transfer of Care (Signed)
Immediate Anesthesia Transfer of Care Note  Patient: Becky Olson  Procedure(s) Performed: LEFT BREAST LUMPECTOMY WITH RADIOACTIVE SEED LOCALIZATION (Left: Breast)  Patient Location: PACU  Anesthesia Type:General  Level of Consciousness: drowsy and patient cooperative  Airway & Oxygen Therapy: Patient Spontanous Breathing and Patient connected to face mask oxygen  Post-op Assessment: Report given to RN and Post -op Vital signs reviewed and stable  Post vital signs: Reviewed and stable  Last Vitals:  Vitals Value Taken Time  BP 122/48 02/22/22 1509  Temp    Pulse 64 02/22/22 1510  Resp 12 02/22/22 1510  SpO2 97 % 02/22/22 1510  Vitals shown include unvalidated device data.  Last Pain:  Vitals:   02/22/22 1316  TempSrc: Oral  PainSc: 0-No pain      Patients Stated Pain Goal: 4 (63/84/53 6468)  Complications: No notable events documented.

## 2022-02-22 NOTE — Anesthesia Postprocedure Evaluation (Signed)
Anesthesia Post Note  Patient: Becky Olson  Procedure(s) Performed: LEFT BREAST LUMPECTOMY WITH RADIOACTIVE SEED LOCALIZATION (Left: Breast)     Patient location during evaluation: PACU Anesthesia Type: General Level of consciousness: awake Pain management: pain level controlled Vital Signs Assessment: post-procedure vital signs reviewed and stable Respiratory status: spontaneous breathing Cardiovascular status: stable Postop Assessment: no apparent nausea or vomiting Anesthetic complications: no   No notable events documented.  Last Vitals:  Vitals:   02/22/22 1515 02/22/22 1530  BP: (!) 126/51 (!) 144/57  Pulse: 65 75  Resp: 11 12  Temp:    SpO2: 98% 100%    Last Pain:  Vitals:   02/22/22 1530  TempSrc:   PainSc: 0-No pain                 Kanyon Bunn

## 2022-02-22 NOTE — Op Note (Signed)
Pre-op Diagnosis:  Invasive ductal carcinoma left breast Post-op Diagnosis: same Procedure:  Left radioactive seed localized lumpectomy Surgeon:  Soraiya Ahner K. Anesthesia:  GEN - LMA Indications:  This is a 71 year old female who presents with a recent routine screening mammogram that revealed some asymmetry in the left breast  There was no sonographic correlate.  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  The tumor is estimated to measure 7 mm in diameter.  She has had a previous biopsy of the left breast at age 55 for calcifications.  Biopsy was benign.  She has had multiple abdominal surgeries.  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  No blood thinners.  Family history of breast cancer both a maternal as well as a paternal aunt.  Description of procedure: The patient is brought to the operating room placed in supine position on the operating room table. After an adequate level of general anesthesia was obtained, her left breast was prepped with ChloraPrep and draped in sterile fashion. A timeout was taken to ensure the proper patient and proper procedure. We interrogated the breast with the neoprobe. We made a circumareolar incision around the upper medial side of the nipple after infiltrating with 0.25% Marcaine. Dissection was carried down in the breast tissue with cautery. We used the neoprobe to guide [place] towards the radioactive seed. We excised an area of tissue around the radioactive seed 2 cm in diameter. The specimen was removed and was oriented with a paint kit. Specimen mammogram showed the radioactive seed as well as the biopsy clip within the specimen. This was sent for pathologic examination. There is no residual radioactivity within the biopsy cavity. We inspected carefully for hemostasis. The wound was thoroughly irrigated. The wound was closed with a deep  layer of 3-0 Vicryl and a subcuticular layer of 4-0 Monocryl. Benzoin Steri-Strips were applied. The patient was then extubated and brought to the recovery room in stable condition. All sponge, instrument, and needle counts are correct.  Imogene Burn. Georgette Dover, MD, [facility]  General/ Trauma Surgery  02/22/2022 3:02 PM

## 2022-02-23 ENCOUNTER — Encounter (HOSPITAL_BASED_OUTPATIENT_CLINIC_OR_DEPARTMENT_OTHER): Payer: Self-pay | Admitting: Surgery

## 2022-02-24 LAB — SURGICAL PATHOLOGY

## 2022-02-28 ENCOUNTER — Encounter: Payer: Self-pay | Admitting: *Deleted

## 2022-03-01 ENCOUNTER — Telehealth: Payer: Self-pay | Admitting: Genetic Counselor

## 2022-03-01 ENCOUNTER — Encounter (HOSPITAL_COMMUNITY): Payer: Self-pay

## 2022-03-01 ENCOUNTER — Other Ambulatory Visit: Payer: Self-pay | Admitting: Genetic Counselor

## 2022-03-01 DIAGNOSIS — Z170 Estrogen receptor positive status [ER+]: Secondary | ICD-10-CM

## 2022-03-01 NOTE — Telephone Encounter (Signed)
Blood sample sent to [facility] for genetic testing on 7/12 failed.  Need additional sample.  Called Ms. Strike and left message about scheduling blood draw.  Tentatively scheduled for 8/9 at 3:30 before her other appt at [facility].  Contact information provided in case she has questions or wishes to reschedule second lab draw.

## 2022-03-08 ENCOUNTER — Other Ambulatory Visit: Payer: Self-pay

## 2022-03-08 ENCOUNTER — Inpatient Hospital Stay: Payer: Medicare Other | Attending: Hematology and Oncology | Admitting: Hematology and Oncology

## 2022-03-08 ENCOUNTER — Inpatient Hospital Stay: Payer: Medicare Other

## 2022-03-08 DIAGNOSIS — C50312 Malignant neoplasm of lower-inner quadrant of left female breast: Secondary | ICD-10-CM | POA: Diagnosis not present

## 2022-03-08 DIAGNOSIS — Z170 Estrogen receptor positive status [ER+]: Secondary | ICD-10-CM | POA: Diagnosis not present

## 2022-03-08 DIAGNOSIS — Z803 Family history of malignant neoplasm of breast: Secondary | ICD-10-CM | POA: Insufficient documentation

## 2022-03-08 DIAGNOSIS — Z800 Family history of malignant neoplasm of digestive organs: Secondary | ICD-10-CM | POA: Diagnosis not present

## 2022-03-08 LAB — GENETIC SCREENING ORDER

## 2022-03-08 NOTE — Progress Notes (Unsigned)
[place] CONSULT NOTE  Patient Care Team: Lajean Manes, MD as PCP - General (Internal Medicine) Mauro Kaufmann, RN as Oncology Nurse Navigator Rockwell Germany, RN as Oncology Nurse Navigator  CHIEF COMPLAINTS/PURPOSE OF CONSULTATION:  Newly diagnosed breast cancer  HISTORY OF PRESENTING ILLNESS:  Becky Olson 71 y.o. female is here because of recent diagnosis of left breast cancer  I reviewed her records extensively and collaborated the history with the patient.  SUMMARY OF ONCOLOGIC HISTORY: Oncology History  Malignant neoplasm of lower-inner quadrant of left breast in female, estrogen receptor positive ([place])  01/09/2022 Mammogram   Screening mammogram detected possible asymmetry in the left breast.  Diagnostic mammogram and ultrasound showed persistent possibly irregular/spiculated subtle left breast asymmetry located medially.  There was no ultrasound correlate hence a stereotactic biopsy was recommended.   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  Per verbal report at breast [place] today prognostics showed ER 95% strong PR 50% moderate, Ki-67 of 2% and HER2 negative   02/06/2022 Initial Diagnosis   Malignant neoplasm of lower-inner quadrant of left breast in female, estrogen receptor positive ([place])   02/22/2022 Definitive Surgery   She had left breast lumpectomy which showed minute focus of invasive ductal carcinoma measuring less than 1 mm and a separate minute focus of DCIS once again like measuring less than 1 mm and clear margins of resection.    Patient arrived to the appointment today by herself.  Since last visit she had surgery and she has been healing very well.  No concerns.  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    SURGICAL HISTORY: Past Surgical History:  Procedure Laterality Date   ABDOMINAL HYSTERECTOMY     APPENDECTOMY  1985   BREAST BIOPSY Left 1985   BREAST BIOPSY Left 01/30/2022   BREAST EXCISIONAL BIOPSY Left 1985   BREAST LUMPECTOMY WITH RADIOACTIVE SEED LOCALIZATION Left 02/22/2022   Procedure: LEFT BREAST LUMPECTOMY WITH RADIOACTIVE SEED LOCALIZATION;  Surgeon: Donnie Mesa, MD;  Location: [facility];  Service: General;  Laterality: Left;  LMA   [place]   COLONOSCOPY     GALLBLADDER SURGERY  1984   VESICOVAGINAL FISTULA CLOSURE W/ TAH  1985    SOCIAL HISTORY: Social History   Socioeconomic History   Marital status: Legally Separated    Spouse name: Not on file   Number of children: 1   Years of education: Not on file   Highest education level: Not on file  Occupational History   Occupation: Hydrologist: [employer]  Tobacco Use   Smoking status: Never   Smokeless tobacco: Never  Vaping Use   Vaping Use: Never used  Substance and Sexual Activity   Alcohol use: No   Drug use: No   Sexual activity: Not Currently    Birth control/protection: Surgical    Comment: hyst  Other Topics Concern   Not  on file  Social History Narrative   Not on file   Social Determinants of Health   Financial Resource Strain: Low Risk  (02/08/2022)   Overall Financial Resource Strain (CARDIA)    Difficulty of Paying Living Expenses: Not very hard  Food Insecurity: No Food Insecurity (02/08/2022)   Hunger Vital Sign    Worried About Running Out of Food in the Last Year: Never true    Ran Out of Food in the Last Year: Never true  Transportation Needs: No Transportation Needs (02/08/2022)   PRAPARE - Armed forces logistics/support/administrative officer (Medical): No    Lack of Transportation (Non-Medical): No  Physical Activity: Not on file  Stress: Not on file  Social Connections: Not on file  Intimate Partner Violence: Not on file    FAMILY HISTORY: Family History  Problem Relation Age of Onset   Stomach cancer Father 11   Prostate cancer Father        dx after 75   Prostate cancer Brother        dx after 6   Prostate cancer Brother        dx after 30   Breast cancer Maternal Aunt        80s   Breast cancer Paternal Aunt        dx after 54    ALLERGIES:  has No Known Allergies.  MEDICATIONS:  Current Outpatient Medications  Medication Sig Dispense Refill   albuterol (VENTOLIN HFA) 108 (90 Base) MCG/ACT inhaler INHALE 2 PUFFS EVERY 4 HOURS AS DIRECTED - RESCUE INHALER 8.5 g PRN   alendronate (FOSAMAX) 70 MG tablet Take 70 mg by mouth once a week.     amLODipine (NORVASC) 2.5 MG tablet Take 2.5 mg by mouth daily.     atorvastatin (LIPITOR) 10 MG tablet Take 10 mg by mouth daily.     calcium citrate-vitamin D (CITRACAL+D) 315-200 MG-UNIT per tablet Take 1 tablet by mouth daily.     cholecalciferol (VITAMIN D) 25 MCG (1000 UNIT) tablet 1 capsule     hydrochlorothiazide (MICROZIDE) 12.5 MG capsule Take 12.5 mg by mouth daily.     mesalamine (LIALDA) 1.2 g EC tablet      No current facility-administered medications for this visit.    REVIEW OF SYSTEMS:   Constitutional: Denies fevers, chills or abnormal night sweats Eyes: Denies blurriness of vision, double vision or watery eyes Ears, nose, mouth, throat, and face: Denies mucositis or sore throat Respiratory: Denies cough, dyspnea or wheezes Cardiovascular: Denies palpitation, chest discomfort or lower extremity swelling Gastrointestinal:  Denies nausea, heartburn or change in bowel habits Skin: Denies abnormal skin rashes Lymphatics: Denies new lymphadenopathy or easy bruising Neurological:Denies numbness, tingling or new  weaknesses Behavioral/Psych: Mood is stable, no new changes  Breast: Denies any palpable lumps or discharge All other systems were reviewed with the patient and are negative.  PHYSICAL EXAMINATION: ECOG PERFORMANCE STATUS: 0 - Asymptomatic  Vitals:   03/08/22 1554  BP: (!) 176/68  Pulse: 80  Resp: 16  Temp: 97.7 F (36.5 C)  SpO2: 98%   Filed Weights   03/08/22 1554  Weight: 152 lb (68.9 kg)    GENERAL:alert, no distress and comfortable Left breast lumpectomy site appears to be healing well.  No axillary lymph nodes submitted in the specimen.  LABORATORY DATA:  I have reviewed the data as listed Lab Results  Component Value Date   WBC 7.7 02/08/2022   HGB 13.7 02/08/2022   HCT  41.5 02/08/2022   MCV 89.4 02/08/2022   PLT 218 02/08/2022   Lab Results  Component Value Date   NA 141 02/08/2022   K 4.1 02/08/2022   CL 103 02/08/2022   CO2 32 02/08/2022    RADIOGRAPHIC STUDIES: I have personally reviewed the radiological reports and agreed with the findings in the report.  ASSESSMENT AND PLAN:  Malignant neoplasm of lower-inner quadrant of left breast in female, estrogen receptor positive ([place]) This is a pleasant 71 year old postmenopausal female patient with newly diagnosed left breast screen detected asymmetry.  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  Given small size and favorable pathology findings, we agree with upfront surgery followed by review of final pathology.  At this time we have discussed about antiestrogen therapy.  She is here after left breast lumpectomy.  Final pathology showed a minute focus of invasive ductal carcinoma and DCIS measuring less than a millimeter.  Given tumor less than 5 mm on final pathology, we have discussed  that Oncotype may not be beneficial.  She also has several favorable prognostics and may not be a candidate for adjuvant chemotherapy.  She should proceed with radiation as scheduled and return to clinic for follow-up to take antiestrogen therapy.  We have once again discussed options including tamoxifen versus aromatase inhibitors.  She has underlying osteoporosis with a T-score of -4.3 but currently continues on Fosamax.  She is willing to try aromatase inhibitors and continue monitoring likely with a yearly bone density given her severe osteoporosis.  She understands side effects of aromatase inhibitors including but not limited to postmenopausal symptoms, vaginal dryness, arthralgias and bone loss, questionable risk of cardiovascular events.  Her other option would be tamoxifen however she was worried about the blood clots.  Total time spent: 30 minutes including history, physical, review of records, counseling and coordination of care  All questions were answered. The patient knows to call the clinic with any problems, questions or concerns.    Benay Pike, MD 03/09/22

## 2022-03-09 ENCOUNTER — Encounter: Payer: Self-pay | Admitting: Hematology and Oncology

## 2022-03-09 NOTE — Assessment & Plan Note (Signed)
This is a pleasant 71 year old postmenopausal female patient with newly diagnosed left breast screen detected asymmetry.  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  Given small size and favorable pathology findings, we agree with upfront surgery followed by review of final pathology.  At this time we have discussed about antiestrogen therapy.  She is here after left breast lumpectomy.  Final pathology showed a minute focus of invasive ductal carcinoma and DCIS measuring less than a millimeter.  Given tumor less than 5 mm on final pathology, we have discussed that Oncotype may not be beneficial.  She also has several favorable prognostics and may not be a candidate for adjuvant chemotherapy.  She should proceed with radiation as scheduled and return to clinic for follow-up to take antiestrogen therapy.  We have once again discussed options including tamoxifen versus aromatase inhibitors.  She has underlying osteoporosis with a T-score of -4.3 but currently continues on Fosamax.  She is willing to try aromatase inhibitors and continue monitoring likely with a yearly bone density given her severe osteoporosis.  She understands side effects of aromatase inhibitors including but not limited to postmenopausal symptoms, vaginal dryness, arthralgias and bone loss, questionable risk of cardiovascular events.  Her other option would be tamoxifen however she was worried about the blood clots.  Total time spent: 30 minutes including history, physical, review of records, counseling and coordination of care

## 2022-03-15 DIAGNOSIS — H25813 Combined forms of age-related cataract, bilateral: Secondary | ICD-10-CM | POA: Diagnosis not present

## 2022-03-15 DIAGNOSIS — H10413 Chronic giant papillary conjunctivitis, bilateral: Secondary | ICD-10-CM | POA: Diagnosis not present

## 2022-03-16 NOTE — Progress Notes (Signed)
Radiation Oncology         [phone] ________________________________  Name: Becky Olson        MRN: [id number]  Date of Service: 03/22/2022 DOB: [birth year]  [id number], Becky Ha, MD  Benay Pike, MD     REFERRING PHYSICIAN: Benay Pike, MD   DIAGNOSIS: The encounter diagnosis was Malignant neoplasm of lower-inner quadrant of left breast in female, estrogen receptor positive ([place]).   HISTORY OF PRESENT ILLNESS: Becky Olson is a 71 y.o. female originally seen in the multidisciplinary breast clinic for a new diagnosis of left breast cancer. The patient was noted to have a screening detected asymmetry in the left breast on her January 09, 2022 mammogram.  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  Her invasive cancer was ER/PR positive, HER2 negative with a Ki-67 of 2%.    She proceeded with left lumpectomy on 02/22/2022 which revealed a healing biopsy site with minute focus of grade 1 invasive ductal carcinoma measuring less than 1 mm and separate focus of DCIS also less than 1 mm with clear margins.  The closest margin was 3 mm from both invasive and in situ disease.  No lymph nodes were submitted.  She is seen to discuss adjuvant radiotherapy.   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       PAST SURGICAL HISTORY: Past Surgical History:  Procedure Laterality Date   ABDOMINAL HYSTERECTOMY     APPENDECTOMY  1985    BREAST BIOPSY Left 1985   BREAST BIOPSY Left 01/30/2022   BREAST EXCISIONAL BIOPSY Left 1985   BREAST LUMPECTOMY WITH RADIOACTIVE SEED LOCALIZATION Left 02/22/2022   Procedure: LEFT BREAST LUMPECTOMY WITH RADIOACTIVE SEED LOCALIZATION;  Surgeon: Donnie Mesa, MD;  Location: [facility];  Service: General;  Laterality: Left;  LMA   [place]   VESICOVAGINAL FISTULA CLOSURE W/ TAH  1985     FAMILY HISTORY:  Family History  Problem Relation Age of Onset   Stomach cancer Father 29   Prostate cancer Father        dx after 91   Prostate cancer Brother        dx after 30   Prostate cancer Brother        dx after 60   Breast cancer Maternal Aunt        80s   Breast cancer Paternal Aunt        dx after 34     SOCIAL HISTORY:  reports that she has never smoked. She has never used smokeless tobacco. She reports that she does not drink alcohol and does not use drugs.  The patient is single and lives in [place].  She is retired from working as a tow Systems developer. She's accompanied by  her daughter.     ALLERGIES: Patient has no known allergies.   MEDICATIONS:  Current Outpatient Medications  Medication Sig Dispense Refill   neomycin-polymyxin-hydrocortisone (CORTISPORIN) OTIC solution 4 (four) times daily.     albuterol (VENTOLIN HFA) 108 (90 Base) MCG/ACT inhaler INHALE 2 PUFFS EVERY 4 HOURS AS DIRECTED - RESCUE INHALER 8.5 g PRN   alendronate (FOSAMAX) 70 MG tablet Take 70 mg by mouth once a week.     amLODipine (NORVASC) 2.5 MG tablet Take 2.5 mg by mouth daily.     atorvastatin (LIPITOR) 10 MG tablet Take 10 mg by mouth daily.     calcium citrate-vitamin D (CITRACAL+D) 315-200 MG-UNIT per tablet Take 1 tablet by mouth daily.     cholecalciferol (VITAMIN D) 25 MCG (1000 UNIT) tablet 1 capsule     hydrochlorothiazide (MICROZIDE) 12.5 MG capsule Take 12.5 mg by  mouth daily.     mesalamine (LIALDA) 1.2 g EC tablet      No current facility-administered medications for this encounter.     REVIEW OF SYSTEMS: On review of systems, the patient reports that she is doing well overall. She denies any concerns with her incision site in her left breast. No other complaints are verbalized and she had a high BP initially which was rechecked and improved. She is taking her antihypertensive medication, and denies any chest pain, shortness of breath, or headaches. No other complaints are verbalized and when rechecked this came down to 143/65.   PHYSICAL EXAM:  Wt Readings from Last 3 Encounters:  03/22/22 152 lb (68.9 kg)  03/08/22 152 lb (68.9 kg)  02/22/22 151 lb 7.3 oz (68.7 kg)   Temp Readings from Last 3 Encounters:  03/22/22 97.7 F (36.5 C) (Oral)  03/08/22 97.7 F (36.5 C) (Temporal)  02/22/22 97.8 F (36.6 C) (Oral)   BP Readings from Last 3 Encounters:  03/22/22 (!) 143/65  03/08/22 (!) 176/68  02/22/22 (!) 159/62   Pulse Readings from Last 3 Encounters:  03/22/22 74  03/08/22 80  02/22/22 76    In general this is a well appearing Caucasian female in no acute distress. She's alert and oriented x4 and appropriate throughout the examination. Cardiopulmonary assessment is negative for acute distress and she exhibits normal effort.  Her left breast reveals a well-healed surgical incision without erythema, separation, or drainage.    ECOG = 0  0 - Asymptomatic (Fully active, able to carry on all predisease activities without restriction)  1 - Symptomatic but completely ambulatory (Restricted in physically strenuous activity but ambulatory and able to carry out work of a light or sedentary nature. For example, light housework, office work)  2 - Symptomatic, <50% in bed during the day (Ambulatory and capable of all self care but unable to carry out any work activities. Up and about more than 50% of waking hours)  3 - Symptomatic, >50% in  bed, but not bedbound (Capable of only limited self-care, confined to bed or chair 50% or more of waking hours)  4 - Bedbound (Completely disabled. Cannot carry on any self-care. Totally confined to bed or chair)  5 - Death   Eustace Pen MM, Creech RH, Tormey DC, et al. [phone]). "Toxicity and response criteria of the [facility]". [place] Oncol. 5 (6): 649-55    LABORATORY DATA:  Lab Results  Component Value Date   WBC 7.7 02/08/2022   HGB 13.7 02/08/2022   HCT 41.5 02/08/2022   MCV 89.4 02/08/2022   PLT  218 02/08/2022   Lab Results  Component Value Date   NA 141 02/08/2022   K 4.1 02/08/2022   CL 103 02/08/2022   CO2 32 02/08/2022   Lab Results  Component Value Date   ALT 25 02/08/2022   AST 22 02/08/2022   ALKPHOS 69 02/08/2022   BILITOT 0.7 02/08/2022      RADIOGRAPHY: MM Breast Surgical Specimen  Result Date: 02/22/2022 CLINICAL DATA:  Post lumpectomy specimen radiograph EXAM: SPECIMEN RADIOGRAPH OF THE LEFT BREAST COMPARISON:  Previous exam(s). FINDINGS: Status post excision of the left breast. The radioactive seed and biopsy marker clip are present and completely intact. IMPRESSION: Specimen radiograph of the left breast. Electronically Signed   By: Audie Pinto M.D.   On: 02/22/2022 14:51  MM DIAG BREAST TOMO UNI LEFT  Result Date: 02/21/2022 CLINICAL DATA:  71 year old female presenting for needle localization of the left breast. Pre-procedure mammograms were performed to demonstrate clip relationship to the biopsy cavity now that the biopsy site changes have subsided. EXAM: DIGITAL DIAGNOSTIC UNILATERAL LEFT MAMMOGRAM WITH TOMOSYNTHESIS AND CAD TECHNIQUE: Left digital diagnostic mammography and breast tomosynthesis was performed. The images were evaluated with computer-aided detection. COMPARISON:  Previous exam(s). ACR Breast Density Category b: There are scattered areas of fibroglandular density. FINDINGS: Full field MLO and mL  tomosynthesis views of the left breast were performed. The coil shaped biopsy marking clip is at or possibly just inferior to the biopsy site in the left breast. IMPRESSION: The coil shaped biopsy marking clip in the left breast is at or possibly just inferior to the biopsy site demonstrating invasive carcinoma. RECOMMENDATION: Proceed to planned needle localization of the left breast. I have discussed the findings and recommendations with the patient. If applicable, a reminder letter will be sent to the patient regarding the next appointment. BI-RADS CATEGORY  6: Known biopsy-proven malignancy. Electronically Signed   By: Audie Pinto M.D.   On: 02/21/2022 15:48  MM LT RADIOACTIVE SEED LOC MAMMO GUIDE  Result Date: 02/21/2022 CLINICAL DATA:  71 year old female presenting for see localization of the left breast. Patient has a recent diagnosis of left breast invasive ductal carcinoma. EXAM: MAMMOGRAPHIC GUIDED RADIOACTIVE SEED LOCALIZATION OF THE LEFT BREAST COMPARISON:  Previous exam(s). FINDINGS: Patient presents for radioactive seed localization prior to left breast lumpectomy. I met with the patient and we discussed the procedure of seed localization including benefits and alternatives. We discussed the high likelihood of a successful procedure. We discussed the risks of the procedure including infection, bleeding, tissue injury and further surgery. We discussed the low dose of radioactivity involved in the procedure. Informed, written consent was given. The usual time-out protocol was performed immediately prior to the procedure. Using mammographic guidance, sterile technique, 1% lidocaine and an I-125 radioactive seed, the coil biopsy marking clip was localized using a medial approach (preprocedure diagnostic imaging of the left breast demonstrated the clip to be in appropriate position or possibly just inferior to the biopsy site). The follow-up mammogram images confirm the seed in the expected  location and were marked for Dr. Gershon Crane. Follow-up survey of the patient confirms presence of the radioactive seed. Order number of I-125 seed:  [id number]. Total activity: 0.250 mCi reference Date: 02/03/2022 The patient tolerated the procedure well and was released from the [facility]. She was given instructions regarding seed removal. IMPRESSION: Radioactive seed localization left breast. No apparent complications. Electronically Signed   By: Audie Pinto M.D.   On: 02/21/2022 15:46  IMPRESSION/PLAN: 1. Stage IA, pT1a,cN0M0, grade 1, ER/PR positive invasive ductal carcinoma of the left breast. Dr. Lisbeth Renshaw has reviewed her final pathology results.  Today she and I reviewed the nature of what appears to be early stage breast disease.  We did discuss her situation meets the criteria to avoid radiotherapy, as long as she is willing to take antihormone therapy.   After considering her options however, she'd like to remain aggressive about her treatment and desires to proceed with radiation as per national guidelines. We discussed the risks, benefits, short, and long term effects of radiotherapy, as well as the curative intent, and the patient is interested in proceeding.  I reviewed the delivery and logistics of radiotherapy and that Dr. Lisbeth Renshaw recommends 4 weeks of radiotherapy to the left breast with deep inspiration breath-hold technique. Written consent is obtained and placed in the chart, a copy was provided to the patient.  She will simulate today.   In a visit lasting 45 minutes, greater than 50% of the time was spent face to face reviewing her case, as well as in preparation of, discussing, and coordinating the patient's care.    Carola Rhine, [facility]    **Disclaimer: This note was dictated with voice recognition software. Similar sounding words can inadvertently be transcribed and this note may contain transcription errors which may not have been corrected upon publication of note.**

## 2022-03-22 ENCOUNTER — Ambulatory Visit
Admission: RE | Admit: 2022-03-22 | Discharge: 2022-03-22 | Disposition: A | Discharge status: Home / Self Care | Payer: Medicare Other | Source: Ambulatory Visit | Attending: Radiation Oncology | Admitting: Radiation Oncology

## 2022-03-22 ENCOUNTER — Encounter: Payer: Self-pay | Admitting: Radiation Oncology

## 2022-03-22 ENCOUNTER — Other Ambulatory Visit: Payer: Self-pay

## 2022-03-22 VITALS — BP 143/65 | HR 74 | Temp 97.7°F | Resp 18 | Ht 63.0 in | Wt 152.0 lb

## 2022-03-22 DIAGNOSIS — Z803 Family history of malignant neoplasm of breast: Secondary | ICD-10-CM | POA: Diagnosis not present

## 2022-03-22 DIAGNOSIS — C50312 Malignant neoplasm of lower-inner quadrant of left female breast: Secondary | ICD-10-CM | POA: Diagnosis not present

## 2022-03-22 DIAGNOSIS — Z800 Family history of malignant neoplasm of digestive organs: Secondary | ICD-10-CM | POA: Insufficient documentation

## 2022-03-22 DIAGNOSIS — I10 Essential (primary) hypertension: Secondary | ICD-10-CM | POA: Insufficient documentation

## 2022-03-22 DIAGNOSIS — Z79899 Other long term (current) drug therapy: Secondary | ICD-10-CM | POA: Diagnosis not present

## 2022-03-22 DIAGNOSIS — M129 Arthropathy, unspecified: Secondary | ICD-10-CM | POA: Diagnosis not present

## 2022-03-22 DIAGNOSIS — Z8042 Family history of malignant neoplasm of prostate: Secondary | ICD-10-CM | POA: Insufficient documentation

## 2022-03-22 DIAGNOSIS — Z170 Estrogen receptor positive status [ER+]: Secondary | ICD-10-CM | POA: Insufficient documentation

## 2022-03-22 NOTE — Progress Notes (Signed)
Consult appointment for LT breast. I verified patient's identity and began nursing interview w/ daughter Ms. Becky Olson in attendance. Patient reports occasional burning 2/10 and itching sensation of the LT breast. No other issues reported at this time.  Meaningful use complete. NO chances-of pregnancy.  BP (!) 189/64 (BP Location: Right Arm, Patient Position: Sitting)   Pulse 79   Temp 97.7 F (36.5 C) (Oral)   Resp 18   Ht '5\' 3"'$  (1.6 m)   Wt 152 lb (68.9 kg)   SpO2 97%   BMI 26.93 kg/m

## 2022-03-28 ENCOUNTER — Encounter: Payer: Self-pay | Admitting: *Deleted

## 2022-03-28 DIAGNOSIS — C50312 Malignant neoplasm of lower-inner quadrant of left female breast: Secondary | ICD-10-CM | POA: Diagnosis not present

## 2022-03-28 DIAGNOSIS — Z170 Estrogen receptor positive status [ER+]: Secondary | ICD-10-CM | POA: Diagnosis not present

## 2022-03-30 ENCOUNTER — Other Ambulatory Visit: Payer: Self-pay

## 2022-03-30 ENCOUNTER — Ambulatory Visit
Admission: RE | Admit: 2022-03-30 | Discharge: 2022-03-30 | Disposition: A | Discharge status: Home / Self Care | Payer: Medicare Other | Source: Ambulatory Visit | Attending: Radiation Oncology | Admitting: Radiation Oncology

## 2022-03-30 DIAGNOSIS — Z510 Encounter for antineoplastic radiation therapy: Secondary | ICD-10-CM | POA: Diagnosis not present

## 2022-03-30 DIAGNOSIS — C50312 Malignant neoplasm of lower-inner quadrant of left female breast: Secondary | ICD-10-CM | POA: Diagnosis not present

## 2022-03-30 DIAGNOSIS — Z170 Estrogen receptor positive status [ER+]: Secondary | ICD-10-CM | POA: Diagnosis not present

## 2022-03-30 LAB — RAD ONC ARIA SESSION SUMMARY
Course Elapsed Days: 0
Plan Fractions Treated to Date: 1
Plan Prescribed Dose Per Fraction: 2.66 Gy
Plan Total Fractions Prescribed: 16
Plan Total Prescribed Dose: 42.56 Gy
Reference Point Dosage Given to Date: 2.66 Gy
Reference Point Session Dosage Given: 2.66 Gy
Session Number: 1

## 2022-03-30 NOTE — Progress Notes (Signed)
Pt here for patient teaching.  Pt given Radiation and You booklet, skin care instructions, alra deodorant and Radiaplex.    Reviewed areas of pertinence such as fatigue, hair loss, skin changes, breast tenderness, and breast swelling. Pt able to give teach back of to pat skin and use unscented/gentle soap,apply Radiaplex bid, avoid applying anything to skin within 4 hours of treatment, avoid wearing an under wire bra, and to use an electric razor if they must shave. Pt verbalizes understanding of information given and will contact nursing with any questions or concerns.    Ermine Stebbins M. Rhylie Stehr RN, BSN  

## 2022-03-31 ENCOUNTER — Telehealth: Payer: Self-pay | Admitting: Genetic Counselor

## 2022-03-31 ENCOUNTER — Encounter: Payer: Self-pay | Admitting: Genetic Counselor

## 2022-03-31 ENCOUNTER — Ambulatory Visit
Admission: RE | Admit: 2022-03-31 | Discharge: 2022-03-31 | Disposition: A | Discharge status: Home / Self Care | Payer: Medicare Other | Source: Ambulatory Visit | Attending: Radiation Oncology | Admitting: Radiation Oncology

## 2022-03-31 ENCOUNTER — Other Ambulatory Visit: Payer: Self-pay

## 2022-03-31 DIAGNOSIS — C50312 Malignant neoplasm of lower-inner quadrant of left female breast: Secondary | ICD-10-CM | POA: Diagnosis not present

## 2022-03-31 DIAGNOSIS — Z170 Estrogen receptor positive status [ER+]: Secondary | ICD-10-CM | POA: Insufficient documentation

## 2022-03-31 DIAGNOSIS — Z510 Encounter for antineoplastic radiation therapy: Secondary | ICD-10-CM | POA: Insufficient documentation

## 2022-03-31 DIAGNOSIS — Z1379 Encounter for other screening for genetic and chromosomal anomalies: Secondary | ICD-10-CM | POA: Insufficient documentation

## 2022-03-31 LAB — RAD ONC ARIA SESSION SUMMARY
Course Elapsed Days: 1
Plan Fractions Treated to Date: 2
Plan Prescribed Dose Per Fraction: 2.66 Gy
Plan Total Fractions Prescribed: 16
Plan Total Prescribed Dose: 42.56 Gy
Reference Point Dosage Given to Date: 5.32 Gy
Reference Point Session Dosage Given: 2.66 Gy
Session Number: 2

## 2022-03-31 MED ORDER — ALRA NON-METALLIC DEODORANT (RAD-ONC)
1.0000 | Freq: Once | TOPICAL | Status: AC
  Administered 2022-03-31: 1 via TOPICAL

## 2022-03-31 MED ORDER — RADIAPLEXRX EX GEL: Freq: Once | CUTANEOUS | Status: AC

## 2022-03-31 NOTE — Telephone Encounter (Signed)
Contacted patient in attempt to disclose results of genetic testing.  LVM with contact information requesting a call back.  

## 2022-04-04 ENCOUNTER — Other Ambulatory Visit: Payer: Self-pay

## 2022-04-04 ENCOUNTER — Ambulatory Visit: Payer: Self-pay | Admitting: Genetic Counselor

## 2022-04-04 ENCOUNTER — Ambulatory Visit
Admission: RE | Admit: 2022-04-04 | Discharge: 2022-04-04 | Disposition: A | Discharge status: Home / Self Care | Payer: Medicare Other | Source: Ambulatory Visit | Attending: Radiation Oncology | Admitting: Radiation Oncology

## 2022-04-04 DIAGNOSIS — Z1379 Encounter for other screening for genetic and chromosomal anomalies: Secondary | ICD-10-CM

## 2022-04-04 DIAGNOSIS — Z803 Family history of malignant neoplasm of breast: Secondary | ICD-10-CM

## 2022-04-04 DIAGNOSIS — Z170 Estrogen receptor positive status [ER+]: Secondary | ICD-10-CM | POA: Diagnosis not present

## 2022-04-04 DIAGNOSIS — C50312 Malignant neoplasm of lower-inner quadrant of left female breast: Secondary | ICD-10-CM

## 2022-04-04 DIAGNOSIS — Z510 Encounter for antineoplastic radiation therapy: Secondary | ICD-10-CM | POA: Diagnosis not present

## 2022-04-04 DIAGNOSIS — Z8042 Family history of malignant neoplasm of prostate: Secondary | ICD-10-CM

## 2022-04-04 LAB — RAD ONC ARIA SESSION SUMMARY
Course Elapsed Days: 5
Plan Fractions Treated to Date: 3
Plan Prescribed Dose Per Fraction: 2.66 Gy
Plan Total Fractions Prescribed: 16
Plan Total Prescribed Dose: 42.56 Gy
Reference Point Dosage Given to Date: 7.98 Gy
Reference Point Session Dosage Given: 2.66 Gy
Session Number: 3

## 2022-04-04 NOTE — Telephone Encounter (Signed)
Contacted patient in attempt to disclose results of genetic testing.  LVM with contact information requesting a call back.  

## 2022-04-05 ENCOUNTER — Ambulatory Visit
Admission: RE | Admit: 2022-04-05 | Discharge: 2022-04-05 | Disposition: A | Discharge status: Home / Self Care | Payer: Medicare Other | Source: Ambulatory Visit | Attending: Radiation Oncology | Admitting: Radiation Oncology

## 2022-04-05 ENCOUNTER — Other Ambulatory Visit: Payer: Self-pay

## 2022-04-05 DIAGNOSIS — C50312 Malignant neoplasm of lower-inner quadrant of left female breast: Secondary | ICD-10-CM | POA: Diagnosis not present

## 2022-04-05 DIAGNOSIS — Z510 Encounter for antineoplastic radiation therapy: Secondary | ICD-10-CM | POA: Diagnosis not present

## 2022-04-05 DIAGNOSIS — Z170 Estrogen receptor positive status [ER+]: Secondary | ICD-10-CM | POA: Diagnosis not present

## 2022-04-05 LAB — RAD ONC ARIA SESSION SUMMARY
Course Elapsed Days: 6
Plan Fractions Treated to Date: 4
Plan Prescribed Dose Per Fraction: 2.66 Gy
Plan Total Fractions Prescribed: 16
Plan Total Prescribed Dose: 42.56 Gy
Reference Point Dosage Given to Date: 10.64 Gy
Reference Point Session Dosage Given: 2.66 Gy
Session Number: 4

## 2022-04-06 ENCOUNTER — Ambulatory Visit
Admission: RE | Admit: 2022-04-06 | Discharge: 2022-04-06 | Disposition: A | Discharge status: Home / Self Care | Payer: Medicare Other | Source: Ambulatory Visit | Attending: Radiation Oncology | Admitting: Radiation Oncology

## 2022-04-06 ENCOUNTER — Other Ambulatory Visit: Payer: Self-pay

## 2022-04-06 DIAGNOSIS — Z170 Estrogen receptor positive status [ER+]: Secondary | ICD-10-CM | POA: Diagnosis not present

## 2022-04-06 DIAGNOSIS — Z510 Encounter for antineoplastic radiation therapy: Secondary | ICD-10-CM | POA: Diagnosis not present

## 2022-04-06 DIAGNOSIS — C50312 Malignant neoplasm of lower-inner quadrant of left female breast: Secondary | ICD-10-CM | POA: Diagnosis not present

## 2022-04-06 LAB — RAD ONC ARIA SESSION SUMMARY
Course Elapsed Days: 7
Plan Fractions Treated to Date: 5
Plan Prescribed Dose Per Fraction: 2.66 Gy
Plan Total Fractions Prescribed: 16
Plan Total Prescribed Dose: 42.56 Gy
Reference Point Dosage Given to Date: 13.3 Gy
Reference Point Session Dosage Given: 2.66 Gy
Session Number: 5

## 2022-04-07 ENCOUNTER — Ambulatory Visit
Admission: RE | Admit: 2022-04-07 | Discharge: 2022-04-07 | Disposition: A | Discharge status: Home / Self Care | Payer: Medicare Other | Source: Ambulatory Visit | Attending: Radiation Oncology | Admitting: Radiation Oncology

## 2022-04-07 ENCOUNTER — Other Ambulatory Visit: Payer: Self-pay

## 2022-04-07 DIAGNOSIS — C50312 Malignant neoplasm of lower-inner quadrant of left female breast: Secondary | ICD-10-CM | POA: Diagnosis not present

## 2022-04-07 DIAGNOSIS — Z510 Encounter for antineoplastic radiation therapy: Secondary | ICD-10-CM | POA: Diagnosis not present

## 2022-04-07 DIAGNOSIS — Z170 Estrogen receptor positive status [ER+]: Secondary | ICD-10-CM | POA: Diagnosis not present

## 2022-04-07 LAB — RAD ONC ARIA SESSION SUMMARY
Course Elapsed Days: 8
Plan Fractions Treated to Date: 6
Plan Prescribed Dose Per Fraction: 2.66 Gy
Plan Total Fractions Prescribed: 16
Plan Total Prescribed Dose: 42.56 Gy
Reference Point Dosage Given to Date: 15.96 Gy
Reference Point Session Dosage Given: 2.66 Gy
Session Number: 6

## 2022-04-10 ENCOUNTER — Other Ambulatory Visit: Payer: Self-pay

## 2022-04-10 ENCOUNTER — Ambulatory Visit
Admission: RE | Admit: 2022-04-10 | Discharge: 2022-04-10 | Disposition: A | Discharge status: Home / Self Care | Payer: Medicare Other | Source: Ambulatory Visit | Attending: Radiation Oncology | Admitting: Radiation Oncology

## 2022-04-10 DIAGNOSIS — C50312 Malignant neoplasm of lower-inner quadrant of left female breast: Secondary | ICD-10-CM | POA: Diagnosis not present

## 2022-04-10 DIAGNOSIS — Z510 Encounter for antineoplastic radiation therapy: Secondary | ICD-10-CM | POA: Diagnosis not present

## 2022-04-10 DIAGNOSIS — Z170 Estrogen receptor positive status [ER+]: Secondary | ICD-10-CM | POA: Diagnosis not present

## 2022-04-10 LAB — RAD ONC ARIA SESSION SUMMARY
Course Elapsed Days: 11
Plan Fractions Treated to Date: 7
Plan Prescribed Dose Per Fraction: 2.66 Gy
Plan Total Fractions Prescribed: 16
Plan Total Prescribed Dose: 42.56 Gy
Reference Point Dosage Given to Date: 18.62 Gy
Reference Point Session Dosage Given: 2.66 Gy
Session Number: 7

## 2022-04-11 ENCOUNTER — Ambulatory Visit
Admission: RE | Admit: 2022-04-11 | Discharge: 2022-04-11 | Disposition: A | Discharge status: Home / Self Care | Payer: Medicare Other | Source: Ambulatory Visit | Attending: Radiation Oncology | Admitting: Radiation Oncology

## 2022-04-11 ENCOUNTER — Other Ambulatory Visit: Payer: Self-pay

## 2022-04-11 DIAGNOSIS — Z510 Encounter for antineoplastic radiation therapy: Secondary | ICD-10-CM | POA: Diagnosis not present

## 2022-04-11 DIAGNOSIS — Z170 Estrogen receptor positive status [ER+]: Secondary | ICD-10-CM | POA: Diagnosis not present

## 2022-04-11 DIAGNOSIS — C50312 Malignant neoplasm of lower-inner quadrant of left female breast: Secondary | ICD-10-CM | POA: Diagnosis not present

## 2022-04-11 LAB — RAD ONC ARIA SESSION SUMMARY
Course Elapsed Days: 12
Plan Fractions Treated to Date: 8
Plan Prescribed Dose Per Fraction: 2.66 Gy
Plan Total Fractions Prescribed: 16
Plan Total Prescribed Dose: 42.56 Gy
Reference Point Dosage Given to Date: 21.28 Gy
Reference Point Session Dosage Given: 2.66 Gy
Session Number: 8

## 2022-04-12 ENCOUNTER — Other Ambulatory Visit: Payer: Self-pay

## 2022-04-12 ENCOUNTER — Ambulatory Visit
Admission: RE | Admit: 2022-04-12 | Discharge: 2022-04-12 | Disposition: A | Discharge status: Home / Self Care | Payer: Medicare Other | Source: Ambulatory Visit | Attending: Radiation Oncology | Admitting: Radiation Oncology

## 2022-04-12 DIAGNOSIS — C50312 Malignant neoplasm of lower-inner quadrant of left female breast: Secondary | ICD-10-CM | POA: Diagnosis not present

## 2022-04-12 DIAGNOSIS — Z510 Encounter for antineoplastic radiation therapy: Secondary | ICD-10-CM | POA: Diagnosis not present

## 2022-04-12 DIAGNOSIS — Z170 Estrogen receptor positive status [ER+]: Secondary | ICD-10-CM | POA: Diagnosis not present

## 2022-04-12 LAB — RAD ONC ARIA SESSION SUMMARY
Course Elapsed Days: 13
Plan Fractions Treated to Date: 9
Plan Prescribed Dose Per Fraction: 2.66 Gy
Plan Total Fractions Prescribed: 16
Plan Total Prescribed Dose: 42.56 Gy
Reference Point Dosage Given to Date: 23.94 Gy
Reference Point Session Dosage Given: 2.66 Gy
Session Number: 9

## 2022-04-13 ENCOUNTER — Other Ambulatory Visit: Payer: Self-pay

## 2022-04-13 ENCOUNTER — Ambulatory Visit
Admission: RE | Admit: 2022-04-13 | Discharge: 2022-04-13 | Disposition: A | Discharge status: Home / Self Care | Payer: Medicare Other | Source: Ambulatory Visit | Attending: Radiation Oncology | Admitting: Radiation Oncology

## 2022-04-13 DIAGNOSIS — C50312 Malignant neoplasm of lower-inner quadrant of left female breast: Secondary | ICD-10-CM | POA: Diagnosis not present

## 2022-04-13 DIAGNOSIS — Z510 Encounter for antineoplastic radiation therapy: Secondary | ICD-10-CM | POA: Diagnosis not present

## 2022-04-13 DIAGNOSIS — Z170 Estrogen receptor positive status [ER+]: Secondary | ICD-10-CM | POA: Diagnosis not present

## 2022-04-13 LAB — RAD ONC ARIA SESSION SUMMARY
Course Elapsed Days: 14
Plan Fractions Treated to Date: 10
Plan Prescribed Dose Per Fraction: 2.66 Gy
Plan Total Fractions Prescribed: 16
Plan Total Prescribed Dose: 42.56 Gy
Reference Point Dosage Given to Date: 26.6 Gy
Reference Point Session Dosage Given: 2.66 Gy
Session Number: 10

## 2022-04-14 ENCOUNTER — Other Ambulatory Visit: Payer: Self-pay

## 2022-04-14 ENCOUNTER — Ambulatory Visit
Admission: RE | Admit: 2022-04-14 | Discharge: 2022-04-14 | Disposition: A | Discharge status: Home / Self Care | Payer: Medicare Other | Source: Ambulatory Visit | Attending: Radiation Oncology | Admitting: Radiation Oncology

## 2022-04-14 DIAGNOSIS — Z170 Estrogen receptor positive status [ER+]: Secondary | ICD-10-CM | POA: Diagnosis not present

## 2022-04-14 DIAGNOSIS — Z510 Encounter for antineoplastic radiation therapy: Secondary | ICD-10-CM | POA: Diagnosis not present

## 2022-04-14 DIAGNOSIS — C50312 Malignant neoplasm of lower-inner quadrant of left female breast: Secondary | ICD-10-CM | POA: Diagnosis not present

## 2022-04-14 LAB — RAD ONC ARIA SESSION SUMMARY
Course Elapsed Days: 15
Plan Fractions Treated to Date: 11
Plan Prescribed Dose Per Fraction: 2.66 Gy
Plan Total Fractions Prescribed: 16
Plan Total Prescribed Dose: 42.56 Gy
Reference Point Dosage Given to Date: 29.26 Gy
Reference Point Session Dosage Given: 2.66 Gy
Session Number: 11

## 2022-04-17 ENCOUNTER — Other Ambulatory Visit: Payer: Self-pay

## 2022-04-17 ENCOUNTER — Ambulatory Visit
Admission: RE | Admit: 2022-04-17 | Discharge: 2022-04-17 | Disposition: A | Discharge status: Home / Self Care | Payer: Medicare Other | Source: Ambulatory Visit | Attending: Radiation Oncology | Admitting: Radiation Oncology

## 2022-04-17 DIAGNOSIS — Z170 Estrogen receptor positive status [ER+]: Secondary | ICD-10-CM | POA: Diagnosis not present

## 2022-04-17 DIAGNOSIS — Z510 Encounter for antineoplastic radiation therapy: Secondary | ICD-10-CM | POA: Diagnosis not present

## 2022-04-17 DIAGNOSIS — C50312 Malignant neoplasm of lower-inner quadrant of left female breast: Secondary | ICD-10-CM | POA: Diagnosis not present

## 2022-04-17 LAB — RAD ONC ARIA SESSION SUMMARY
Course Elapsed Days: 18
Plan Fractions Treated to Date: 12
Plan Prescribed Dose Per Fraction: 2.66 Gy
Plan Total Fractions Prescribed: 16
Plan Total Prescribed Dose: 42.56 Gy
Reference Point Dosage Given to Date: 31.92 Gy
Reference Point Session Dosage Given: 2.66 Gy
Session Number: 12

## 2022-04-18 ENCOUNTER — Other Ambulatory Visit: Payer: Self-pay

## 2022-04-18 ENCOUNTER — Ambulatory Visit
Admission: RE | Admit: 2022-04-18 | Discharge: 2022-04-18 | Disposition: A | Discharge status: Home / Self Care | Payer: Medicare Other | Source: Ambulatory Visit | Attending: Radiation Oncology | Admitting: Radiation Oncology

## 2022-04-18 DIAGNOSIS — Z510 Encounter for antineoplastic radiation therapy: Secondary | ICD-10-CM | POA: Diagnosis not present

## 2022-04-18 DIAGNOSIS — C50312 Malignant neoplasm of lower-inner quadrant of left female breast: Secondary | ICD-10-CM | POA: Diagnosis not present

## 2022-04-18 DIAGNOSIS — Z170 Estrogen receptor positive status [ER+]: Secondary | ICD-10-CM | POA: Diagnosis not present

## 2022-04-18 LAB — RAD ONC ARIA SESSION SUMMARY
Course Elapsed Days: 19
Plan Fractions Treated to Date: 13
Plan Prescribed Dose Per Fraction: 2.66 Gy
Plan Total Fractions Prescribed: 16
Plan Total Prescribed Dose: 42.56 Gy
Reference Point Dosage Given to Date: 34.58 Gy
Reference Point Session Dosage Given: 2.66 Gy
Session Number: 13

## 2022-04-19 ENCOUNTER — Ambulatory Visit
Admission: RE | Admit: 2022-04-19 | Discharge: 2022-04-19 | Disposition: A | Discharge status: Home / Self Care | Payer: Medicare Other | Source: Ambulatory Visit | Attending: Radiation Oncology | Admitting: Radiation Oncology

## 2022-04-19 ENCOUNTER — Other Ambulatory Visit: Payer: Self-pay

## 2022-04-19 DIAGNOSIS — C50312 Malignant neoplasm of lower-inner quadrant of left female breast: Secondary | ICD-10-CM | POA: Diagnosis not present

## 2022-04-19 DIAGNOSIS — Z170 Estrogen receptor positive status [ER+]: Secondary | ICD-10-CM | POA: Diagnosis not present

## 2022-04-19 DIAGNOSIS — Z510 Encounter for antineoplastic radiation therapy: Secondary | ICD-10-CM | POA: Diagnosis not present

## 2022-04-19 LAB — RAD ONC ARIA SESSION SUMMARY
Course Elapsed Days: 20
Plan Fractions Treated to Date: 14
Plan Prescribed Dose Per Fraction: 2.66 Gy
Plan Total Fractions Prescribed: 16
Plan Total Prescribed Dose: 42.56 Gy
Reference Point Dosage Given to Date: 37.24 Gy
Reference Point Session Dosage Given: 2.66 Gy
Session Number: 14

## 2022-04-20 ENCOUNTER — Ambulatory Visit
Admission: RE | Admit: 2022-04-20 | Discharge: 2022-04-20 | Disposition: A | Discharge status: Home / Self Care | Payer: Medicare Other | Source: Ambulatory Visit | Attending: Radiation Oncology | Admitting: Radiation Oncology

## 2022-04-20 ENCOUNTER — Other Ambulatory Visit: Payer: Self-pay

## 2022-04-20 DIAGNOSIS — C50312 Malignant neoplasm of lower-inner quadrant of left female breast: Secondary | ICD-10-CM | POA: Diagnosis not present

## 2022-04-20 DIAGNOSIS — Z510 Encounter for antineoplastic radiation therapy: Secondary | ICD-10-CM | POA: Diagnosis not present

## 2022-04-20 DIAGNOSIS — Z170 Estrogen receptor positive status [ER+]: Secondary | ICD-10-CM | POA: Diagnosis not present

## 2022-04-20 LAB — RAD ONC ARIA SESSION SUMMARY
Course Elapsed Days: 21
Plan Fractions Treated to Date: 15
Plan Prescribed Dose Per Fraction: 2.66 Gy
Plan Total Fractions Prescribed: 16
Plan Total Prescribed Dose: 42.56 Gy
Reference Point Dosage Given to Date: 39.9 Gy
Reference Point Session Dosage Given: 2.66 Gy
Session Number: 15

## 2022-04-21 ENCOUNTER — Ambulatory Visit: Payer: Medicare Other | Admitting: Radiation Oncology

## 2022-04-21 ENCOUNTER — Other Ambulatory Visit: Payer: Self-pay

## 2022-04-21 ENCOUNTER — Ambulatory Visit
Admission: RE | Admit: 2022-04-21 | Discharge: 2022-04-21 | Disposition: A | Discharge status: Home / Self Care | Payer: Medicare Other | Source: Ambulatory Visit | Attending: Radiation Oncology | Admitting: Radiation Oncology

## 2022-04-21 DIAGNOSIS — C50312 Malignant neoplasm of lower-inner quadrant of left female breast: Secondary | ICD-10-CM | POA: Diagnosis not present

## 2022-04-21 DIAGNOSIS — Z510 Encounter for antineoplastic radiation therapy: Secondary | ICD-10-CM | POA: Diagnosis not present

## 2022-04-21 DIAGNOSIS — Z170 Estrogen receptor positive status [ER+]: Secondary | ICD-10-CM | POA: Diagnosis not present

## 2022-04-21 LAB — RAD ONC ARIA SESSION SUMMARY
Course Elapsed Days: 22
Plan Fractions Treated to Date: 16
Plan Prescribed Dose Per Fraction: 2.66 Gy
Plan Total Fractions Prescribed: 16
Plan Total Prescribed Dose: 42.56 Gy
Reference Point Dosage Given to Date: 42.56 Gy
Reference Point Session Dosage Given: 2.66 Gy
Session Number: 16

## 2022-04-24 ENCOUNTER — Other Ambulatory Visit: Payer: Self-pay

## 2022-04-24 ENCOUNTER — Ambulatory Visit
Admission: RE | Admit: 2022-04-24 | Discharge: 2022-04-24 | Disposition: A | Discharge status: Home / Self Care | Payer: Medicare Other | Source: Ambulatory Visit | Attending: Radiation Oncology | Admitting: Radiation Oncology

## 2022-04-24 DIAGNOSIS — Z510 Encounter for antineoplastic radiation therapy: Secondary | ICD-10-CM | POA: Diagnosis not present

## 2022-04-24 DIAGNOSIS — Z170 Estrogen receptor positive status [ER+]: Secondary | ICD-10-CM | POA: Diagnosis not present

## 2022-04-24 DIAGNOSIS — C50312 Malignant neoplasm of lower-inner quadrant of left female breast: Secondary | ICD-10-CM | POA: Diagnosis not present

## 2022-04-24 LAB — RAD ONC ARIA SESSION SUMMARY
Course Elapsed Days: 25
Plan Fractions Treated to Date: 1
Plan Prescribed Dose Per Fraction: 2 Gy
Plan Total Fractions Prescribed: 4
Plan Total Prescribed Dose: 8 Gy
Reference Point Dosage Given to Date: 2 Gy
Reference Point Session Dosage Given: 2 Gy
Session Number: 17

## 2022-04-25 ENCOUNTER — Ambulatory Visit
Admission: RE | Admit: 2022-04-25 | Discharge: 2022-04-25 | Disposition: A | Discharge status: Home / Self Care | Payer: Medicare Other | Source: Ambulatory Visit | Attending: Radiation Oncology | Admitting: Radiation Oncology

## 2022-04-25 ENCOUNTER — Other Ambulatory Visit: Payer: Self-pay

## 2022-04-25 DIAGNOSIS — C50312 Malignant neoplasm of lower-inner quadrant of left female breast: Secondary | ICD-10-CM | POA: Diagnosis not present

## 2022-04-25 DIAGNOSIS — Z510 Encounter for antineoplastic radiation therapy: Secondary | ICD-10-CM | POA: Diagnosis not present

## 2022-04-25 DIAGNOSIS — Z170 Estrogen receptor positive status [ER+]: Secondary | ICD-10-CM | POA: Diagnosis not present

## 2022-04-25 LAB — RAD ONC ARIA SESSION SUMMARY
Course Elapsed Days: 26
Plan Fractions Treated to Date: 2
Plan Prescribed Dose Per Fraction: 2 Gy
Plan Total Fractions Prescribed: 4
Plan Total Prescribed Dose: 8 Gy
Reference Point Dosage Given to Date: 4 Gy
Reference Point Session Dosage Given: 2 Gy
Session Number: 18

## 2022-04-26 ENCOUNTER — Ambulatory Visit
Admission: RE | Admit: 2022-04-26 | Discharge: 2022-04-26 | Disposition: A | Discharge status: Home / Self Care | Payer: Medicare Other | Source: Ambulatory Visit | Attending: Radiation Oncology | Admitting: Radiation Oncology

## 2022-04-26 ENCOUNTER — Other Ambulatory Visit: Payer: Self-pay

## 2022-04-26 DIAGNOSIS — Z510 Encounter for antineoplastic radiation therapy: Secondary | ICD-10-CM | POA: Diagnosis not present

## 2022-04-26 DIAGNOSIS — Z170 Estrogen receptor positive status [ER+]: Secondary | ICD-10-CM | POA: Diagnosis not present

## 2022-04-26 DIAGNOSIS — C50312 Malignant neoplasm of lower-inner quadrant of left female breast: Secondary | ICD-10-CM | POA: Diagnosis not present

## 2022-04-26 LAB — RAD ONC ARIA SESSION SUMMARY
Course Elapsed Days: 27
Plan Fractions Treated to Date: 3
Plan Prescribed Dose Per Fraction: 2 Gy
Plan Total Fractions Prescribed: 4
Plan Total Prescribed Dose: 8 Gy
Reference Point Dosage Given to Date: 6 Gy
Reference Point Session Dosage Given: 2 Gy
Session Number: 19

## 2022-04-27 ENCOUNTER — Other Ambulatory Visit: Payer: Self-pay

## 2022-04-27 ENCOUNTER — Ambulatory Visit: Payer: Medicare Other

## 2022-04-27 ENCOUNTER — Ambulatory Visit
Admission: RE | Admit: 2022-04-27 | Discharge: 2022-04-27 | Disposition: A | Discharge status: Home / Self Care | Payer: Medicare Other | Source: Ambulatory Visit | Attending: Radiation Oncology | Admitting: Radiation Oncology

## 2022-04-27 ENCOUNTER — Encounter: Payer: Self-pay | Admitting: Radiation Oncology

## 2022-04-27 DIAGNOSIS — C50312 Malignant neoplasm of lower-inner quadrant of left female breast: Secondary | ICD-10-CM | POA: Diagnosis not present

## 2022-04-27 DIAGNOSIS — Z510 Encounter for antineoplastic radiation therapy: Secondary | ICD-10-CM | POA: Diagnosis not present

## 2022-04-27 DIAGNOSIS — Z170 Estrogen receptor positive status [ER+]: Secondary | ICD-10-CM | POA: Diagnosis not present

## 2022-04-27 LAB — RAD ONC ARIA SESSION SUMMARY
Course Elapsed Days: 28
Plan Fractions Treated to Date: 4
Plan Prescribed Dose Per Fraction: 2 Gy
Plan Total Fractions Prescribed: 4
Plan Total Prescribed Dose: 8 Gy
Reference Point Dosage Given to Date: 8 Gy
Reference Point Session Dosage Given: 2 Gy
Session Number: 20

## 2022-04-28 ENCOUNTER — Encounter: Payer: Self-pay | Admitting: *Deleted

## 2022-04-28 DIAGNOSIS — I10 Essential (primary) hypertension: Secondary | ICD-10-CM | POA: Diagnosis not present

## 2022-04-28 DIAGNOSIS — Z170 Estrogen receptor positive status [ER+]: Secondary | ICD-10-CM

## 2022-05-04 ENCOUNTER — Inpatient Hospital Stay: Payer: Medicare Other | Attending: Hematology and Oncology | Admitting: Hematology and Oncology

## 2022-05-04 ENCOUNTER — Inpatient Hospital Stay: Payer: Medicare Other

## 2022-05-04 ENCOUNTER — Other Ambulatory Visit: Payer: Self-pay | Admitting: *Deleted

## 2022-05-04 ENCOUNTER — Encounter: Payer: Self-pay | Admitting: Hematology and Oncology

## 2022-05-04 VITALS — BP 166/60 | HR 90 | Temp 97.8°F | Resp 16 | Ht 63.0 in | Wt 153.3 lb

## 2022-05-04 DIAGNOSIS — Z170 Estrogen receptor positive status [ER+]: Secondary | ICD-10-CM | POA: Diagnosis not present

## 2022-05-04 DIAGNOSIS — C50312 Malignant neoplasm of lower-inner quadrant of left female breast: Secondary | ICD-10-CM | POA: Diagnosis not present

## 2022-05-04 DIAGNOSIS — Z0000 Encounter for general adult medical examination without abnormal findings: Secondary | ICD-10-CM

## 2022-05-04 DIAGNOSIS — Z23 Encounter for immunization: Secondary | ICD-10-CM | POA: Insufficient documentation

## 2022-05-04 DIAGNOSIS — M810 Age-related osteoporosis without current pathological fracture: Secondary | ICD-10-CM | POA: Diagnosis not present

## 2022-05-04 MED ORDER — ANASTROZOLE 1 MG PO TABS: 1.0000 mg | ORAL_TABLET | Freq: Every day | ORAL | 3 refills | Status: DC

## 2022-05-04 MED ORDER — INFLUENZA VAC A&B SA ADJ QUAD 0.5 ML IM PRSY
0.5000 mL | PREFILLED_SYRINGE | Freq: Once | INTRAMUSCULAR | Status: AC
  Administered 2022-05-04: 0.5 mL via INTRAMUSCULAR
  Filled 2022-05-04: qty 0.5

## 2022-05-04 MED ORDER — INFLUENZA VAC A&B SA ADJ QUAD 0.5 ML IM PRSY
0.5000 mL | PREFILLED_SYRINGE | Freq: Once | INTRAMUSCULAR | Status: DC
  Filled 2022-05-04: qty 0.5

## 2022-05-04 NOTE — Progress Notes (Signed)
Correction in location of administration of Flu shot- given in Right deltoid not left

## 2022-05-04 NOTE — Assessment & Plan Note (Addendum)
This is a pleasant 71 year old postmenopausal female patient with newly diagnosed left breast screen detected asymmetry.  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  Given small size and favorable pathology findings, we agree with upfront surgery followed by review of final pathology.  At this time we have discussed about antiestrogen therapy.  She is here after left breast lumpectomy.  Final pathology showed a minute focus of invasive ductal carcinoma and DCIS measuring less than a millimeter.    Given tumor less than 5 mm on final pathology, we have discussed that Oncotype may not be beneficial.  Postop she is now status post adjuvant radiation.  We have today discussed about antiestrogen therapy with the tamoxifen versus aromatase inhibitors.  I discussed mechanism of action of both and adverse effects with each class.  She does have underlying diagnosis of osteoporosis, last T score of -2.7 about 2 years ago and a bone density scan.  She previously took bisphosphonates for several years but currently is only on calcium and vitamin D supplementation.  She will proceed with adjuvant radiation and return to clinic to follow-up with me in the second week of December.  She already has another bone density scheduled at that time.  If there is no significant deterioration of bone density, I think she is a good candidate for aromatase inhibitors and monitoring of her bone density.  She was a little worried about blood clots with tamoxifen. All her questions were otherwise answered.  She will return to clinic as scheduled.

## 2022-05-04 NOTE — Progress Notes (Signed)
[facility] CONSULT NOTE  Patient Care Team: Merlene Laughter, MD as PCP - General (Internal Medicine) Pershing Proud, RN as Oncology Nurse Navigator Donnelly Angelica, RN as Oncology Nurse Navigator  CHIEF COMPLAINTS/PURPOSE OF CONSULTATION:  Newly diagnosed breast cancer  HISTORY OF PRESENTING ILLNESS:  Becky Olson 71 y.o. female is here because of recent diagnosis of left breast cancer  I reviewed her records extensively and collaborated the history with the patient.  SUMMARY OF ONCOLOGIC HISTORY: Oncology History  Malignant neoplasm of lower-inner quadrant of left breast in female, estrogen receptor positive (HCC)  01/09/2022 Mammogram   Screening mammogram detected possible asymmetry in the left breast.  Diagnostic mammogram and ultrasound showed persistent possibly irregular/spiculated subtle left breast asymmetry located medially.  There was no ultrasound correlate hence a stereotactic biopsy was recommended.   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  Per verbal report at breast [facility] today prognostics showed ER 95% strong PR 50% moderate, Ki-67 of 2% and HER2 negative   02/06/2022 Initial Diagnosis   Malignant neoplasm of lower-inner quadrant of left breast in female, estrogen receptor positive (HCC)   02/22/2022 Definitive Surgery   She had left breast lumpectomy which showed minute focus of invasive ductal carcinoma measuring less than 1 mm and a separate minute focus of DCIS once again like measuring less than 1 mm and clear margins of resection.   03/24/2022 Genetic Testing   Negative [facility] CancerNext-Expanded +RNAinsight.  Report date is 03/24/2022.   The CancerNext-Expanded gene panel offered by [facility] and includes sequencing, rearrangement, and RNA analysis for the following 77 genes: AIP, ALK, APC, ATM, AXIN2, BAP1, BARD1, BLM, BMPR1A,  BRCA1, BRCA2, BRIP1, CDC73, CDH1, CDK4, CDKN1B, CDKN2A, CHEK2, CTNNA1, DICER1, FANCC, FH, FLCN, GALNT12, KIF1B, LZTR1, MAX, MEN1, MET, MLH1, MSH2, MSH3, MSH6, MUTYH, NBN, NF1, NF2, NTHL1, PALB2, PHOX2B, PMS2, POT1, PRKAR1A, PTCH1, PTEN, RAD51C, RAD51D, RB1, RECQL, RET, SDHA, SDHAF2, SDHB, SDHC, SDHD, SMAD4, SMARCA4, SMARCB1, SMARCE1, STK11, SUFU, TMEM127, TP53, TSC1, TSC2, VHL and XRCC2 (sequencing and deletion/duplication); EGFR, EGLN1, HOXB13, KIT, MITF, PDGFRA, POLD1, and POLE (sequencing only); EPCAM and GREM1 (deletion/duplication only).     Interval history  She is now status post adjuvant radiation, tolerated it very well, completed last week.  She is here to discuss about antiestrogen therapy.  Rest of the pertinent 10 point review of systems reviewed and negative.  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    SURGICAL HISTORY: Past Surgical History:  Procedure Laterality Date   ABDOMINAL HYSTERECTOMY     APPENDECTOMY  1985   BREAST BIOPSY Left 1985   BREAST BIOPSY Left 01/30/2022   BREAST EXCISIONAL BIOPSY Left 1985   BREAST LUMPECTOMY WITH RADIOACTIVE SEED LOCALIZATION Left 02/22/2022   Procedure: LEFT BREAST LUMPECTOMY WITH RADIOACTIVE SEED LOCALIZATION;  Surgeon: Manus Rudd, MD;  Location: [facility];  Service: General;  Laterality: Left;  LMA   BREAST SURGERY  1995   CHOLECYSTECTOMY     COLON SURGERY  1984  COLONOSCOPY     GALLBLADDER SURGERY  1984   VESICOVAGINAL FISTULA CLOSURE W/ TAH  1985    SOCIAL HISTORY: Social History   Socioeconomic History   Marital status: Divorced    Spouse name: Not on file   Number of children: 1   Years of  education: Not on file   Highest education level: Not on file  Occupational History   Occupation: clerical    Employer: [employer]  Tobacco Use   Smoking status: Never   Smokeless tobacco: Never  Vaping Use   Vaping Use: Never used  Substance and Sexual Activity   Alcohol use: No   Drug use: No   Sexual activity: Not Currently    Birth control/protection: Surgical    Comment: hyst  Other Topics Concern   Not on file  Social History Narrative   Not on file   Social Determinants of Health   Financial Resource Strain: Low Risk  (02/08/2022)   Overall Financial Resource Strain (CARDIA)    Difficulty of Paying Living Expenses: Not very hard  Food Insecurity: No Food Insecurity (02/08/2022)   Hunger Vital Sign    Worried About Running Out of Food in the Last Year: Never true    Ran Out of Food in the Last Year: Never true  Transportation Needs: No Transportation Needs (02/08/2022)   PRAPARE - Hydrologist (Medical): No    Lack of Transportation (Non-Medical): No  Physical Activity: Not on file  Stress: Not on file  Social Connections: Not on file  Intimate Partner Violence: Not on file    FAMILY HISTORY: Family History  Problem Relation Age of Onset   Stomach cancer Father 45   Prostate cancer Father        dx after 39   Prostate cancer Brother        dx after 68   Prostate cancer Brother        dx after 28   Breast cancer Maternal Aunt        80s   Breast cancer Paternal Aunt        dx after 55    ALLERGIES:  has No Known Allergies.  MEDICATIONS:  Current Outpatient Medications  Medication Sig Dispense Refill   anastrozole (ARIMIDEX) 1 MG tablet Take 1 tablet (1 mg total) by mouth daily. 90 tablet 3   albuterol (VENTOLIN HFA) 108 (90 Base) MCG/ACT inhaler INHALE 2 PUFFS EVERY 4 HOURS AS DIRECTED - RESCUE INHALER 8.5 g PRN   alendronate (FOSAMAX) 70 MG tablet Take 70 mg by mouth once a week.     amLODipine (NORVASC) 2.5 MG  tablet Take 2.5 mg by mouth daily.     atorvastatin (LIPITOR) 10 MG tablet Take 10 mg by mouth daily.     calcium citrate-vitamin D (CITRACAL+D) 315-200 MG-UNIT per tablet Take 1 tablet by mouth daily.     cholecalciferol (VITAMIN D) 25 MCG (1000 UNIT) tablet 1 capsule     hydrochlorothiazide (MICROZIDE) 12.5 MG capsule Take 12.5 mg by mouth daily.     mesalamine (LIALDA) 1.2 g EC tablet      neomycin-polymyxin-hydrocortisone (CORTISPORIN) OTIC solution 4 (four) times daily.     No current facility-administered medications for this visit.    REVIEW OF SYSTEMS:   Constitutional: Denies fevers, chills or abnormal night sweats Eyes: Denies blurriness of vision, double vision or watery eyes Ears, nose, mouth, throat, and face: Denies mucositis or sore throat Respiratory: Denies cough,  dyspnea or wheezes Cardiovascular: Denies palpitation, chest discomfort or lower extremity swelling Gastrointestinal:  Denies nausea, heartburn or change in bowel habits Skin: Denies abnormal skin rashes Lymphatics: Denies new lymphadenopathy or easy bruising Neurological:Denies numbness, tingling or new weaknesses Behavioral/Psych: Mood is stable, no new changes  Breast: Denies any palpable lumps or discharge All other systems were reviewed with the patient and are negative.  PHYSICAL EXAMINATION: ECOG PERFORMANCE STATUS: 0 - Asymptomatic  Vitals:   05/04/22 0938  BP: (!) 166/60  Pulse: 90  Resp: 16  Temp: 97.8 F (36.6 C)  SpO2: 97%   Filed Weights   05/04/22 0938  Weight: 153 lb 4.8 oz (69.5 kg)    GENERAL:alert, no distress and comfortable Left breast lumpectomy site status postradiation, some erythema, otherwise no concerns.  LABORATORY DATA:  I have reviewed the data as listed Lab Results  Component Value Date   WBC 7.7 02/08/2022   HGB 13.7 02/08/2022   HCT 41.5 02/08/2022   MCV 89.4 02/08/2022   PLT 218 02/08/2022   Lab Results  Component Value Date   NA 141 02/08/2022   K  4.1 02/08/2022   CL 103 02/08/2022   CO2 32 02/08/2022    RADIOGRAPHIC STUDIES: I have personally reviewed the radiological reports and agreed with the findings in the report.  ASSESSMENT AND PLAN:  Malignant neoplasm of lower-inner quadrant of left breast in female, estrogen receptor positive ([place]) This is a pleasant 71 year old postmenopausal female patient with newly diagnosed left breast screen detected asymmetry.  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  Given small size and favorable pathology findings, we agree with upfront surgery followed by review of final pathology.  At this time we have discussed about antiestrogen therapy.  She is here after left breast lumpectomy.  Final pathology showed a minute focus of invasive ductal carcinoma and DCIS measuring less than a millimeter.    Given tumor less than 5 mm on final pathology, we have discussed that Oncotype may not be beneficial.  Postop she is now status post adjuvant radiation.  We have today discussed about antiestrogen therapy with the tamoxifen versus aromatase inhibitors.  I discussed mechanism of action of both and adverse effects with each class.  She does have underlying diagnosis of osteoporosis, last T score of -2.7 about 2 years ago and a bone density scan.  She previously took bisphosphonates for several years but currently is only on calcium and vitamin D supplementation.  She will proceed with adjuvant radiation and return to clinic to follow-up with me in the second week of December.  She already has another bone density scheduled at that time.  If there is no significant deterioration of bone density, I think she is a good candidate for aromatase inhibitors and monitoring of her bone density.  She  was a little worried about blood clots with tamoxifen. All her questions were otherwise answered.  She will return to clinic as scheduled.  Total time spent: 30 minutes including history, physical exam, review of records, counseling and coordination of care All questions were answered. The patient knows to call the clinic with any problems, questions or concerns.    Benay Pike, MD 05/04/22

## 2022-05-04 NOTE — Addendum Note (Signed)
Addended by: Laureen Abrahams on: 05/04/2022 10:08 AM   Modules accepted: Orders

## 2022-05-16 NOTE — Progress Notes (Signed)
HPI:   Becky Olson was previously seen in the [place] [facility] due to a personal history of breast cancer, a family history of breast/prostate cancer, and concerns regarding a hereditary predisposition to cancer. Please refer to our prior cancer genetics clinic note for more information regarding our discussion, assessment and recommendations, at the time. Becky Olson recent genetic test results were disclosed to her, as were recommendations warranted by these results. These results and recommendations are discussed in more detail below.  CANCER HISTORY:  Oncology History  Malignant neoplasm of lower-inner quadrant of left breast in female, estrogen receptor positive ([place])  01/09/2022 Mammogram   Screening mammogram detected possible asymmetry in the left breast.  Diagnostic mammogram and ultrasound showed persistent possibly irregular/spiculated subtle left breast asymmetry located medially.  There was no ultrasound correlate hence a stereotactic biopsy was recommended.   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  Per verbal report at breast [facility] today prognostics showed ER 95% strong PR 50% moderate, Ki-67 of 2% and HER2 negative   02/06/2022 Initial Diagnosis   Malignant neoplasm of lower-inner quadrant of left breast in female, estrogen receptor positive ([place])   02/22/2022 Definitive Surgery   She had left breast lumpectomy which showed minute focus of invasive ductal carcinoma measuring less than 1 mm and a separate minute focus of DCIS once again like measuring less than 1 mm and clear margins of resection.   03/24/2022 Genetic Testing   Negative [facility] CancerNext-Expanded +RNAinsight.  Report date is 03/24/2022.   The CancerNext-Expanded gene panel offered by [facility] and includes sequencing, rearrangement, and RNA analysis for the following 77 genes: AIP,  ALK, APC, ATM, AXIN2, BAP1, BARD1, BLM, BMPR1A, BRCA1, BRCA2, BRIP1, CDC73, CDH1, CDK4, CDKN1B, CDKN2A, CHEK2, CTNNA1, DICER1, FANCC, FH, FLCN, GALNT12, KIF1B, LZTR1, MAX, MEN1, MET, MLH1, MSH2, MSH3, MSH6, MUTYH, NBN, NF1, NF2, NTHL1, PALB2, PHOX2B, PMS2, POT1, PRKAR1A, PTCH1, PTEN, RAD51C, RAD51D, RB1, RECQL, RET, SDHA, SDHAF2, SDHB, SDHC, SDHD, SMAD4, SMARCA4, SMARCB1, SMARCE1, STK11, SUFU, TMEM127, TP53, TSC1, TSC2, VHL and XRCC2 (sequencing and deletion/duplication); EGFR, EGLN1, HOXB13, KIT, MITF, PDGFRA, POLD1, and POLE (sequencing only); EPCAM and GREM1 (deletion/duplication only).      FAMILY HISTORY:  We obtained a detailed, 4-generation family history.  Significant diagnoses are listed below: Family History  Problem Relation Age of Onset   Stomach cancer Father 24   Prostate cancer Father        dx after 66   Prostate cancer Brother        dx after 23   Prostate cancer Brother        dx after 72   Breast cancer Maternal Aunt        80s   Breast cancer Paternal Aunt        dx after 62     Becky Olson is unaware of previous family history of genetic testing for hereditary cancer risks. There is no reported Ashkenazi Jewish ancestry. There is no known consanguinity  GENETIC TEST RESULTS:  The [facility] CancerNext-Expanded +RNAinsight Panel found no pathogenic mutations. The CancerNext-Expanded gene panel offered by [facility] and includes sequencing, rearrangement, and RNA analysis for the following 77 genes: AIP, ALK, APC, ATM, AXIN2, BAP1, BARD1, BLM, BMPR1A, BRCA1, BRCA2, BRIP1, CDC73, CDH1, CDK4, CDKN1B, CDKN2A, CHEK2, CTNNA1, DICER1, FANCC, FH, FLCN, GALNT12, KIF1B, LZTR1, MAX, MEN1, MET, MLH1, MSH2, MSH3, MSH6, MUTYH, NBN, NF1, NF2, NTHL1,  PALB2, PHOX2B, PMS2, POT1, PRKAR1A, PTCH1, PTEN, RAD51C, RAD51D, RB1, RECQL, RET, SDHA, SDHAF2, SDHB, SDHC, SDHD, SMAD4, SMARCA4, SMARCB1, SMARCE1, STK11, SUFU, TMEM127, TP53, TSC1, TSC2, VHL and XRCC2 (sequencing and deletion/duplication);  EGFR, EGLN1, HOXB13, KIT, MITF, PDGFRA, POLD1, and POLE (sequencing only); EPCAM and GREM1 (deletion/duplication only).   The test report has been scanned into EPIC and is located under the Molecular Pathology section of the Results Review tab.  A portion of the result report is included below for reference. Genetic testing reported out on March 24, 2022.      Even though a pathogenic variant was not identified, possible explanations for the cancer in the family may include: There may be no hereditary risk for cancer in the family. The cancers in Becky Olson and/or her family may be sporadic/familial or due to other genetic and environmental factors. There may be a gene mutation in one of these genes that current testing methods cannot detect but that chance is small. There could be another gene that has not yet been discovered, or that we have not yet tested, that is responsible for the cancer diagnoses in the family.  It is also possible there is a hereditary cause for the cancer in the family that Becky Olson did not inherit.   Therefore, it is important to remain in touch with cancer genetics in the future so that we can continue to offer Becky Olson the most up to date genetic testing.    ADDITIONAL GENETIC TESTING:  We discussed with Becky Olson that her genetic testing was fairly extensive.  If there are additional relevant genes identified to increase cancer risk that can be analyzed in the future, we would be happy to discuss and coordinate this testing at that time.     CANCER SCREENING RECOMMENDATIONS:  Becky Olson test result is considered negative (normal).  This means that we have not identified a hereditary cause for her personal history of breast cancer at this time.   An individual's cancer risk and medical management are not determined by genetic test results alone. Overall cancer risk assessment incorporates additional factors, including personal medical history, family  history, and any available genetic information that may result in a personalized plan for cancer prevention and surveillance. Therefore, it is recommended she continue to follow the cancer management and screening guidelines provided by her oncology and primary healthcare provider.  RECOMMENDATIONS FOR FAMILY MEMBERS:   Since she did not inherit a identifiable mutation in a cancer predisposition gene included on this panel, her daughter could not have inherited a known mutation from her in one of these genes. Individuals in this family might be at some increased risk of developing cancer, over the general population risk, due to the family history of cancer.  Individuals in the family should notify their providers of the family history of cancer. We recommend women in this family have a yearly mammogram beginning at age 40, or 10 years younger than the earliest onset of cancer, an annual clinical breast exam, and perform monthly breast self-exams.  Female family members should speak with their providers about prostate cancer screening.  Other members of the family may still carry a pathogenic variant in one of these genes that Ms. Kaucher did not inherit. Based on the family history, we recommend her brothers, who have a history of prostate cancer, have genetic counseling and testing. Ms. Gelin can let us know if we can be of any assistance in coordinating genetic counseling and/or testing for this   family member.     FOLLOW-UP:  Lastly, we discussed with Becky Olson that cancer genetics is a rapidly advancing field and it is possible that new genetic tests will be appropriate for her and/or her family members in the future. We encouraged her to remain in contact with cancer genetics on an annual basis so we can update her personal and family histories and let her know of advances in cancer genetics that may benefit this family.   Our contact number was provided. Becky Olson's questions were answered to  her satisfaction, and she knows she is welcome to call us at anytime with additional questions or concerns.    M. , MS, LCGC Genetic Counselor [email or web address] (P) [phone]  

## 2022-05-16 NOTE — Progress Notes (Signed)
                                                                                                                                                             Patient Name: Becky Olson MRN: [id number] DOB: 03/02/1951 Referring Physician: Lajean Manes (Profile Not Attached) Date of Service: 04/27/2022 [facility], [place]                                                        End Of Treatment Note  Diagnoses: C50.312-Malignant neoplasm of lower-inner quadrant of left female breast  Cancer Staging: Stage IA, pT1a,cN0M0, grade 1, ER/PR positive invasive ductal carcinoma of the left breast   Intent: Curative  Radiation Treatment Dates: 03/30/2022 through 04/27/2022 Site Technique Total Dose (Gy) Dose per Fx (Gy) Completed Fx Beam Energies  Breast, Left: Breast_L 3D 42.56/42.56 2.66 16/16 10XFFF  Breast, Left: Breast_L_Bst 3D 8/8 2 4/4 6X, 10X   Narrative: The patient tolerated radiation therapy relatively well. She developed fatigue and anticipated skin changes in the treatment field.   Plan:The patient will receive a call in about one month from the radiation oncology department. She will continue follow up with Dr. Chryl Heck as well.   ________________________________________________    Carola Rhine, [facility]

## 2022-05-17 ENCOUNTER — Other Ambulatory Visit: Payer: Self-pay | Admitting: *Deleted

## 2022-06-05 ENCOUNTER — Ambulatory Visit
Admission: RE | Admit: 2022-06-05 | Discharge: 2022-06-05 | Disposition: A | Discharge status: Home / Self Care | Payer: Medicare Other | Source: Ambulatory Visit | Attending: Radiation Oncology | Admitting: Radiation Oncology

## 2022-06-05 NOTE — Progress Notes (Addendum)
  Radiation Oncology         [phone] ________________________________  Name: Becky Olson MRN: [id number]  Date of Service: 06/05/2022  DOB: 11-12-1950  Post Treatment Telephone Note  Diagnosis:  Stage IA, pT1a,cN0M0, grade 1, ER/PR positive invasive ductal carcinoma of the left breast   Intent: Curative  Radiation Treatment Dates: 03/30/2022 through 04/27/2022 Site Technique Total Dose (Gy) Dose per Fx (Gy) Completed Fx Beam Energies  Breast, Left: Breast_L 3D 42.56/42.56 2.66 16/16 10XFFF  Breast, Left: Breast_L_Bst 3D 8/8 2 4/4 6X, 10X   (as documented in provider EOT note)   The patient was available for call today.   Symptoms of fatigue have improved since completing therapy.  Symptoms of skin changes have improved since completing therapy.  The patient was encouraged to avoid sun exposure in the area of prior treatment for up to one year following radiation with either sunscreen or by the style of clothing worn in the sun.  The patient has scheduled follow up with her medical oncologist Dr. Chryl Heck for ongoing surveillance, and was encouraged to call if she develops concerns or questions regarding radiation.   This concludes this nursing interview.  Leandra Kern, LPN

## 2022-06-06 ENCOUNTER — Telehealth: Payer: Self-pay | Admitting: *Deleted

## 2022-06-06 NOTE — Telephone Encounter (Signed)
Pt left VM wanting to let Dr Chryl Heck know that she started the Anastrozole on 05/19/2022- and states she is not having any side effects.  This message will be forwarded to MD for review.

## 2022-06-30 ENCOUNTER — Telehealth: Payer: Self-pay | Admitting: Adult Health

## 2022-06-30 NOTE — Telephone Encounter (Signed)
Rescheduled appointment per provider template. Left voicemail.

## 2022-08-04 ENCOUNTER — Inpatient Hospital Stay: Payer: Medicare Other | Attending: Hematology and Oncology | Admitting: Adult Health

## 2022-08-04 ENCOUNTER — Encounter: Payer: Self-pay | Admitting: Adult Health

## 2022-08-04 ENCOUNTER — Encounter: Payer: Medicare Other | Admitting: Adult Health

## 2022-08-04 VITALS — BP 178/72 | HR 78 | Temp 98.2°F | Wt 153.0 lb

## 2022-08-04 DIAGNOSIS — Z79811 Long term (current) use of aromatase inhibitors: Secondary | ICD-10-CM | POA: Insufficient documentation

## 2022-08-04 DIAGNOSIS — Z170 Estrogen receptor positive status [ER+]: Secondary | ICD-10-CM | POA: Insufficient documentation

## 2022-08-04 DIAGNOSIS — C50312 Malignant neoplasm of lower-inner quadrant of left female breast: Secondary | ICD-10-CM | POA: Diagnosis present

## 2022-08-04 NOTE — Progress Notes (Unsigned)
Called and LVM with [facility] requesting Pts last colonoscopy results per Wilber Bihari, NP. Fax and phone number given.  Called [facility] [place] for [facility] records including immunizations. Fax number given with staff member confirming.

## 2022-08-04 NOTE — Progress Notes (Signed)
SURVIVORSHIP  VISIT:    BRIEF ONCOLOGIC HISTORY:  Oncology History  Malignant neoplasm of lower-inner quadrant of left breast in female, estrogen receptor positive ([place])  01/09/2022 Mammogram   Screening mammogram detected possible asymmetry in the left breast.  Diagnostic mammogram and ultrasound showed persistent possibly irregular/spiculated subtle left breast asymmetry located medially.  There was no ultrasound correlate hence a stereotactic biopsy was recommended.   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  Per verbal report at breast [place] today prognostics showed ER 95% strong PR 50% moderate, Ki-67 of 2% and HER2 negative   02/22/2022 Definitive Surgery   She had left breast lumpectomy which showed minute focus of invasive ductal carcinoma measuring less than 1 mm and a separate minute focus of DCIS once again like measuring less than 1 mm and clear margins of resection.   03/24/2022 Genetic Testing   Negative [facility] CancerNext-Expanded +RNAinsight.  Report date is 03/24/2022.   The CancerNext-Expanded gene panel offered by [facility] and includes sequencing, rearrangement, and RNA analysis for the following 77 genes: AIP, ALK, APC, ATM, AXIN2, BAP1, BARD1, BLM, BMPR1A, BRCA1, BRCA2, BRIP1, CDC73, CDH1, CDK4, CDKN1B, CDKN2A, CHEK2, CTNNA1, DICER1, FANCC, FH, FLCN, GALNT12, KIF1B, LZTR1, MAX, MEN1, MET, MLH1, MSH2, MSH3, MSH6, MUTYH, NBN, NF1, NF2, NTHL1, PALB2, PHOX2B, PMS2, POT1, PRKAR1A, PTCH1, PTEN, RAD51C, RAD51D, RB1, RECQL, RET, SDHA, SDHAF2, SDHB, SDHC, SDHD, SMAD4, SMARCA4, SMARCB1, SMARCE1, STK11, SUFU, TMEM127, TP53, TSC1, TSC2, VHL and XRCC2 (sequencing and deletion/duplication); EGFR, EGLN1, HOXB13, KIT, MITF, PDGFRA, POLD1, and POLE (sequencing only); EPCAM and GREM1 (deletion/duplication only).    03/30/2022 - 04/27/2022 Radiation Therapy   Site Technique Total Dose  (Gy) Dose per Fx (Gy) Completed Fx Beam Energies  Breast, Left: Breast_L 3D 42.56/42.56 2.66 16/16 10XFFF  Breast, Left: Breast_L_Bst 3D 8/8 2 4/4 6X, 10X     04/2022 -  Anti-estrogen oral therapy   Anastrozole     INTERVAL HISTORY:  Becky Olson to review her survivorship care plan detailing her treatment course for breast cancer, as well as monitoring long-term side effects of that treatment, education regarding health maintenance, screening, and overall wellness and health promotion.     Overall, Becky Olson reports feeling quite well.  She is taking anastrozole daily with good tolerance. She denies any significant issues.  She has h/o osteoporosis and is taking fosamax weekly, repeat bone density is 10/2022.  REVIEW OF SYSTEMS:  Review of Systems  Constitutional:  Negative for appetite change, chills, fatigue, fever and unexpected weight change.  HENT:   Negative for hearing loss, lump/mass and trouble swallowing.   Eyes:  Negative for eye problems and icterus.  Respiratory:  Negative for chest tightness, cough and shortness of breath.   Cardiovascular:  Negative for chest pain, leg swelling and palpitations.  Gastrointestinal:  Negative for abdominal distention, abdominal pain, constipation, diarrhea, nausea and vomiting.  Endocrine: Negative for hot flashes.  Genitourinary:  Negative for difficulty urinating.   Musculoskeletal:  Negative for arthralgias.  Skin:  Negative for itching and rash.  Neurological:  Negative for dizziness, extremity weakness, headaches and numbness.  Hematological:  Negative for adenopathy. Does not bruise/bleed easily.  Psychiatric/Behavioral:  Negative for depression. The patient is not nervous/anxious.   Breast: Denies any new nodularity, masses, tenderness, nipple changes, or nipple discharge.       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   Past Surgical History:  Procedure Laterality Date   ABDOMINAL HYSTERECTOMY     APPENDECTOMY  1985   BREAST BIOPSY Left 1985   BREAST BIOPSY Left 01/30/2022   BREAST EXCISIONAL BIOPSY Left 1985   BREAST LUMPECTOMY WITH RADIOACTIVE SEED LOCALIZATION Left 02/22/2022   Procedure: LEFT BREAST LUMPECTOMY WITH RADIOACTIVE SEED LOCALIZATION;  Surgeon: Donnie Mesa, MD;  Location: [facility];  Service: General;  Laterality: Left;  [place]   VESICOVAGINAL FISTULA CLOSURE W/ [place]:  No Known Allergies   CURRENT MEDICATIONS:  Outpatient Encounter Medications as of 08/04/2022  Medication Sig   albuterol (VENTOLIN HFA) 108 (90 Base) MCG/ACT inhaler INHALE 2 PUFFS EVERY 4 HOURS AS DIRECTED - RESCUE INHALER   alendronate (FOSAMAX) 70 MG tablet Take 70 mg by mouth once a week.   amLODipine (NORVASC) 2.5 MG tablet Take 2.5 mg by mouth daily.   anastrozole (ARIMIDEX) 1 MG tablet Take 1 tablet (1 mg total) by mouth daily.   atorvastatin (LIPITOR) 10 MG tablet Take 10 mg by mouth daily.   calcium citrate-vitamin D (CITRACAL+D) 315-200 MG-UNIT per tablet Take 1 tablet by mouth daily.   cholecalciferol (VITAMIN D) 25 MCG (1000 UNIT) tablet 1 capsule   hydrochlorothiazide (MICROZIDE) 12.5 MG capsule Take 12.5 mg by mouth daily.   mesalamine (LIALDA) 1.2 g EC tablet    neomycin-polymyxin-hydrocortisone (CORTISPORIN) OTIC solution 4 (four) times daily.   No facility-administered encounter medications on file as of 08/04/2022.     ONCOLOGIC FAMILY HISTORY:  Family History  Problem Relation Age of Onset   Stomach cancer Father 26   Prostate cancer  Father        dx after 31   Prostate cancer Brother        dx after 40   Prostate cancer Brother        dx after 34   Breast cancer Maternal Aunt        80s   Breast cancer Paternal Aunt        dx after 60    SOCIAL HISTORY:  Social History   Socioeconomic History   Marital status: Divorced    Spouse name: Not on file   Number of children: 1   Years of education: Not on file   Highest education level: Not on file  Occupational History   Occupation: Hydrologist: [employer]  Tobacco Use   Smoking status: Never   Smokeless tobacco: Never  Vaping Use   Vaping Use: Never used  Substance and Sexual Activity   Alcohol use: No   Drug use: No   Sexual activity: Not Currently    Birth control/protection: Surgical    Comment: hyst  Other Topics Concern   Not on file  Social History Narrative   Not on file   Social Determinants of Health   Financial Resource Strain: Low Risk  (02/08/2022)   Overall Financial Resource Strain (CARDIA)  Difficulty of Paying Living Expenses: Not very hard  Food Insecurity: No Food Insecurity (02/08/2022)   Hunger Vital Sign    Worried About Running Out of Food in the Last Year: Never true    Ran Out of Food in the Last Year: Never true  Transportation Needs: No Transportation Needs (02/08/2022)   PRAPARE - Hydrologist (Medical): No    Lack of Transportation (Non-Medical): No  Physical Activity: Not on file  Stress: Not on file  Social Connections: Not on file  Intimate Partner Violence: Not on file     OBSERVATIONS/OBJECTIVE:  BP (!) 178/72 Comment: manual recheck; provider notified  Pulse 78   Temp 98.2 F (36.8 C)   Wt 153 lb (69.4 kg)   SpO2 99%   BMI 27.10 kg/m  GENERAL: Patient is a well appearing female in no acute distress HEENT:  Sclerae anicteric.  Oropharynx clear and moist. No ulcerations or evidence of oropharyngeal candidiasis. Neck is supple.  NODES:  No cervical,  supraclavicular, or axillary lymphadenopathy palpated.  BREAST EXAM: Left breast status postlumpectomy and radiation no sign of local recurrence right breast is benign. LUNGS:  Clear to auscultation bilaterally.  No wheezes or rhonchi. HEART:  Regular rate and rhythm. No murmur appreciated. ABDOMEN:  Soft, nontender.  Positive, normoactive bowel sounds. No organomegaly palpated. MSK:  No focal spinal tenderness to palpation. Full range of motion bilaterally in the upper extremities. EXTREMITIES:  No peripheral edema.   SKIN:  Clear with no obvious rashes or skin changes. No nail dyscrasia. NEURO:  Nonfocal. Well oriented.  Appropriate affect.  LABORATORY DATA:  None for this visit.  DIAGNOSTIC IMAGING:  None for this visit.      ASSESSMENT AND PLAN:  Becky Olson is a pleasant 72 y.o. female with Stage 1A left breast invasive ductal carcinoma, ER+/PR+/HER2-, diagnosed in July 2023, treated with lumpectomy, adjuvant radiation therapy, and anti-estrogen therapy with anastrozole beginning in October 2023.  She presents to the Survivorship Clinic for our initial meeting and routine follow-up post-completion of treatment for breast cancer.    1. Stage 1A left breast cancer:  Becky Olson is continuing to recover from definitive treatment for breast cancer. She will follow-up with her medical oncologist, Dr. Chryl Heck in 6 months with history and physical exam per surveillance protocol.  She will continue her anti-estrogen therapy with anastrozole. Thus far, she is tolerating the anastrozole well, with minimal side effects. . Her mammogram is due June 2024; orders placed today. Today, a comprehensive survivorship care plan and treatment summary was reviewed with the patient today detailing her breast cancer diagnosis, treatment course, potential late/long-term effects of treatment, appropriate follow-up care with recommendations for the future, and patient education resources.  A copy of this summary,  along with a letter will be sent to the patient's primary care provider via mail/fax/In Basket message after today's visit.    2. Bone health:  Given Becky Olson's age/history of breast cancer and her current treatment regimen including anti-estrogen therapy with anastrozole, she is at risk for bone demineralization.  Her last DEXA scan was January 27, 2019 and demonstrated osteoporosis with a T-score of -4.3 in the AP spine L2-L4.  She has repeat bone density testing scheduled in April 2024.  She is taking Fosamax weekly since shortly after her bone density testing in 2020 that found osteoporosis.  She was given education on specific activities to promote bone health.  3. Cancer screening:  Due to Becky Olson's  history and her age, she should receive screening for skin cancers, colon cancer.  The information and recommendations are listed on the patient's comprehensive care plan/treatment summary and were reviewed in detail with the patient.    4. Health maintenance and wellness promotion: Becky Olson was encouraged to consume 5-7 servings of fruits and vegetables per day. We reviewed the "Nutrition Rainbow" handout.  She was also encouraged to engage in moderate to vigorous exercise for 30 minutes per day most days of the week. We discussed the [employer] fitness program, which is designed for cancer survivors to help them become more physically fit after cancer treatments.  She was instructed to limit her alcohol consumption and continue to abstain from tobacco use.     5. Support services/counseling: It is not uncommon for this period of the patient's cancer care trajectory to be one of many emotions and stressors.   She was given information regarding our available services and encouraged to contact me with any questions or for help enrolling in any of our support group/programs.    Follow up instructions:    -Return to cancer center in 6 months for follow-up with Dr. Chryl Heck -Mammogram due in  June 2024 -She is welcome to return back to the Survivorship Clinic at any time; no additional follow-up needed at this time.  -Consider referral back to survivorship as a long-term survivor for continued surveillance  The patient was provided an opportunity to ask questions and all were answered. The patient agreed with the plan and demonstrated an understanding of the instructions.   Total encounter time:40 minutes*in face-to-face visit time, chart review, lab review, care coordination, order entry, and documentation of the encounter time.  Wilber Bihari, NP 08/04/22 12:57 PM Medical Oncology and Hematology [facility] [place], [place] [address] Tel. [phone]    Fax. [phone]  *Total Encounter Time as defined by the Centers for Medicare and Medicaid Services includes, in addition to the face-to-face time of a patient visit (documented in the note above) non-face-to-face time: obtaining and reviewing outside history, ordering and reviewing medications, tests or procedures, care coordination (communications with other health care professionals or caregivers) and documentation in the medical record.

## 2022-08-09 ENCOUNTER — Encounter: Payer: Self-pay | Admitting: Hematology and Oncology

## 2022-08-16 ENCOUNTER — Encounter: Payer: Self-pay | Admitting: Hematology and Oncology

## 2022-08-24 ENCOUNTER — Telehealth: Payer: Self-pay

## 2022-08-24 NOTE — Telephone Encounter (Signed)
Returned Pt's call regarding derm appt. Pt expressed gratitude for NP's derm recc as they found possible melanoma. Thanked Pt for following up with NP recc and instructed Pt to call back with any questions or concerns.

## 2022-10-30 ENCOUNTER — Ambulatory Visit
Admission: RE | Admit: 2022-10-30 | Discharge: 2022-10-30 | Disposition: A | Discharge status: Home / Self Care | Payer: Medicare Other | Source: Ambulatory Visit | Attending: Hematology and Oncology | Admitting: Hematology and Oncology

## 2022-10-30 DIAGNOSIS — M810 Age-related osteoporosis without current pathological fracture: Secondary | ICD-10-CM

## 2022-11-01 ENCOUNTER — Telehealth: Payer: Self-pay

## 2022-11-01 NOTE — Telephone Encounter (Signed)
-----   Message from Benay Pike, MD sent at 10/31/2022  5:31 PM EDT ----- Bone density is better. She should continue fosamax. Can you let her know.  Thanks,

## 2022-11-01 NOTE — Telephone Encounter (Signed)
Called and given below message. Pt verbalized understanding. 

## 2022-11-16 ENCOUNTER — Other Ambulatory Visit: Payer: Self-pay | Admitting: *Deleted

## 2022-11-16 MED ORDER — ANASTROZOLE 1 MG PO TABS: 1.0000 mg | ORAL_TABLET | Freq: Every day | ORAL | 3 refills | Status: DC

## 2022-12-16 IMAGING — MG MM DIGITAL SCREENING BILAT W/ TOMO AND CAD
8 series · 8 of 24 positions shown · non-contrast
Comparison: Previous exam(s).

CLINICAL DATA: Screening.

EXAM:
DIGITAL SCREENING BILATERAL MAMMOGRAM WITH TOMOSYNTHESIS AND CAD
TECHNIQUE: Bilateral screening digital craniocaudal and mediolateral oblique
mammograms were obtained. Bilateral screening digital breast
tomosynthesis was performed. The images were evaluated with
computer-aided detection.

[L MLO synth-2D]
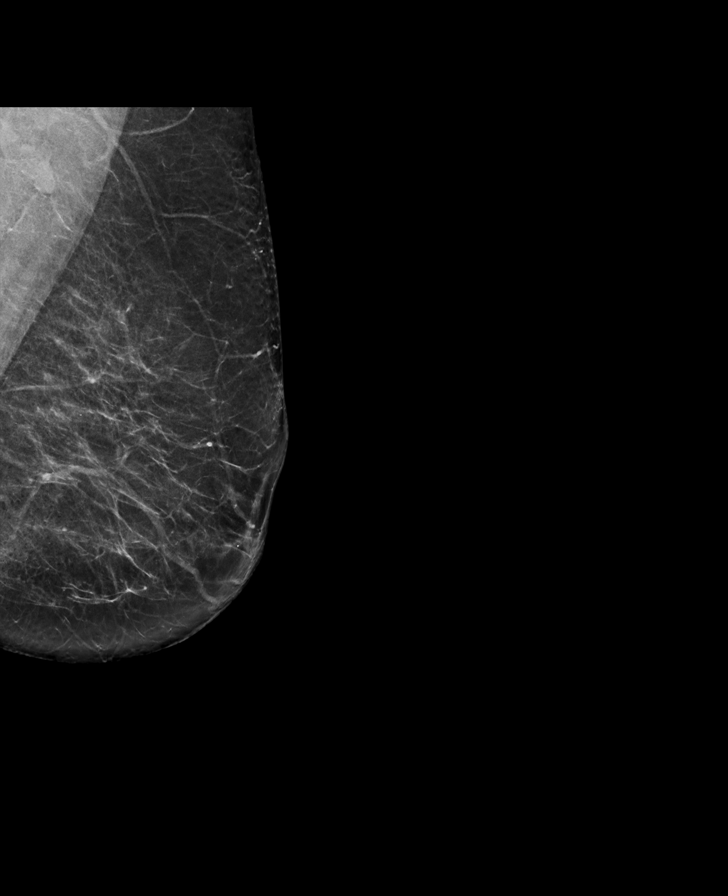

[L CC synth-2D]
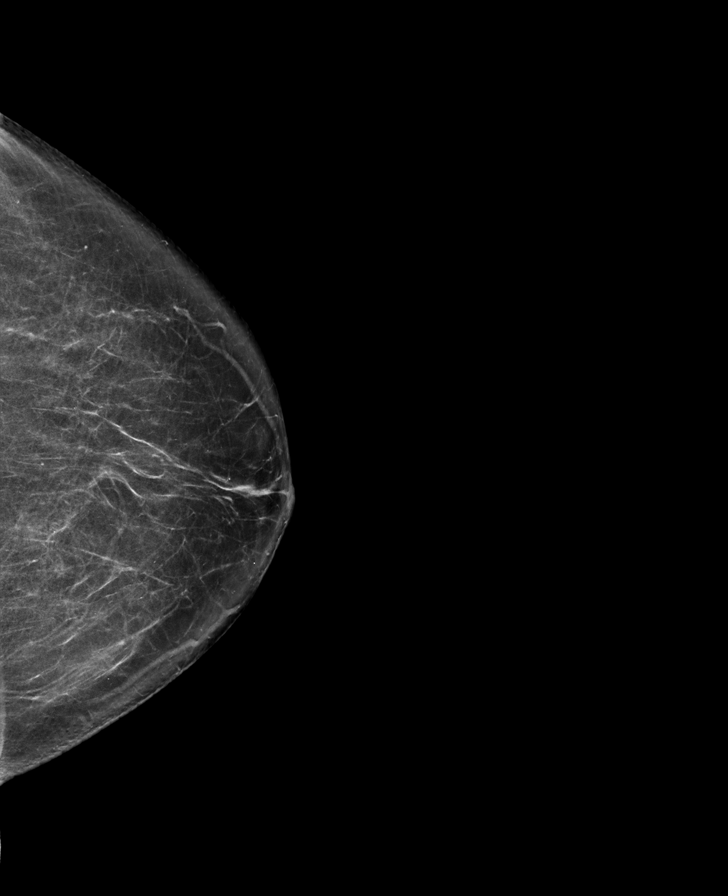

[R MLO synth-2D]
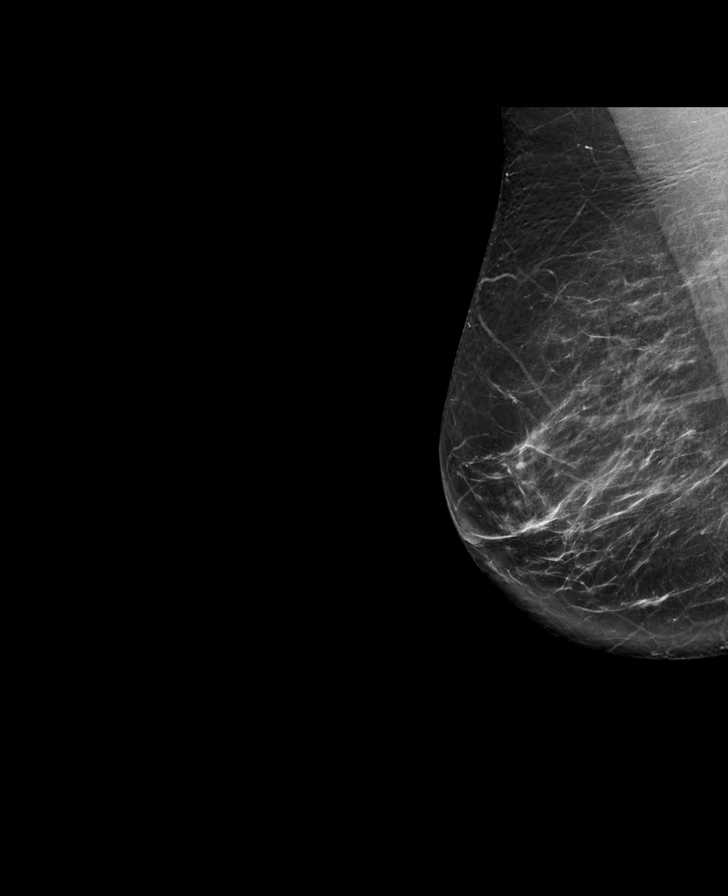

[R CC synth-2D]
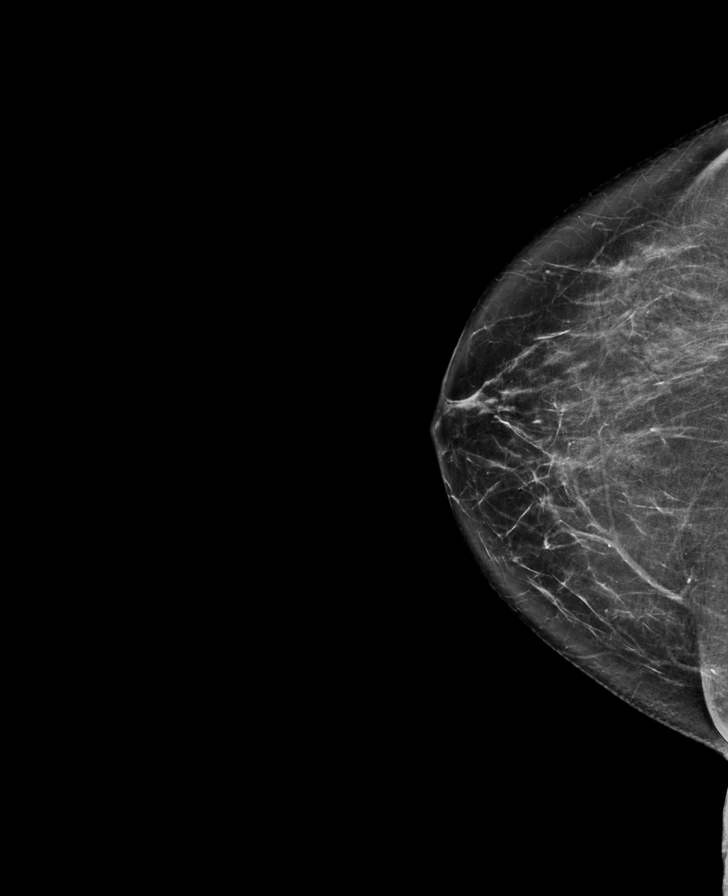

[L MLO tomo · tomo slice 37/72.0]
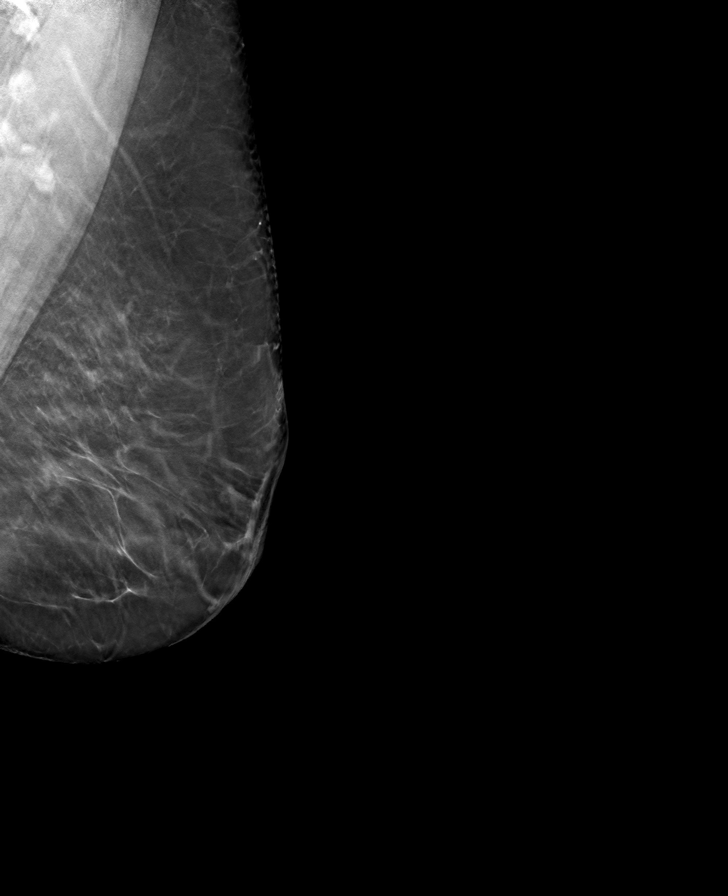

[R MLO tomo · tomo slice 37/73.0]
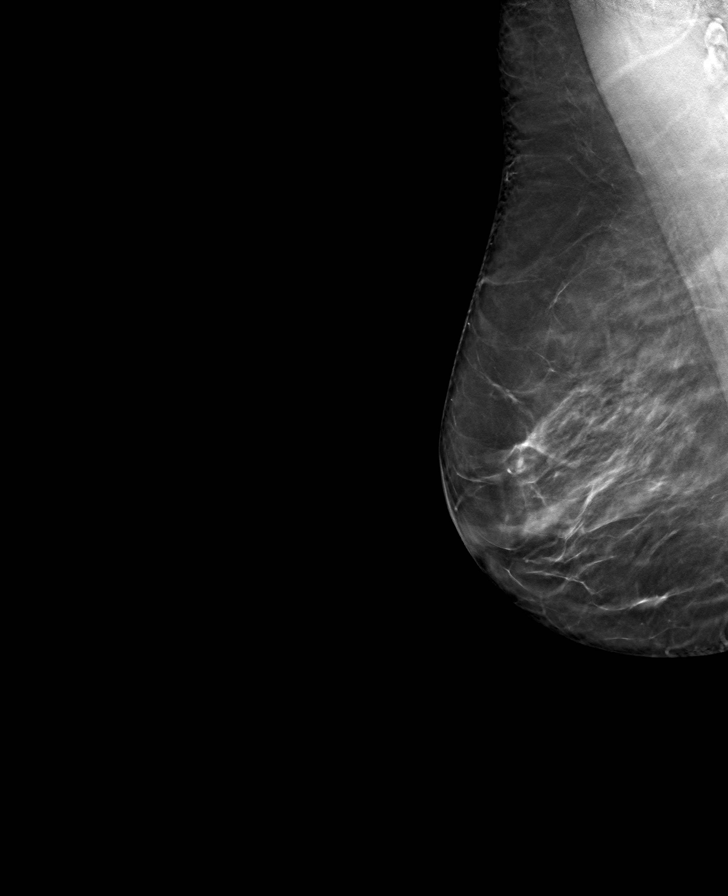

[L CC tomo · tomo slice 33/66.0]
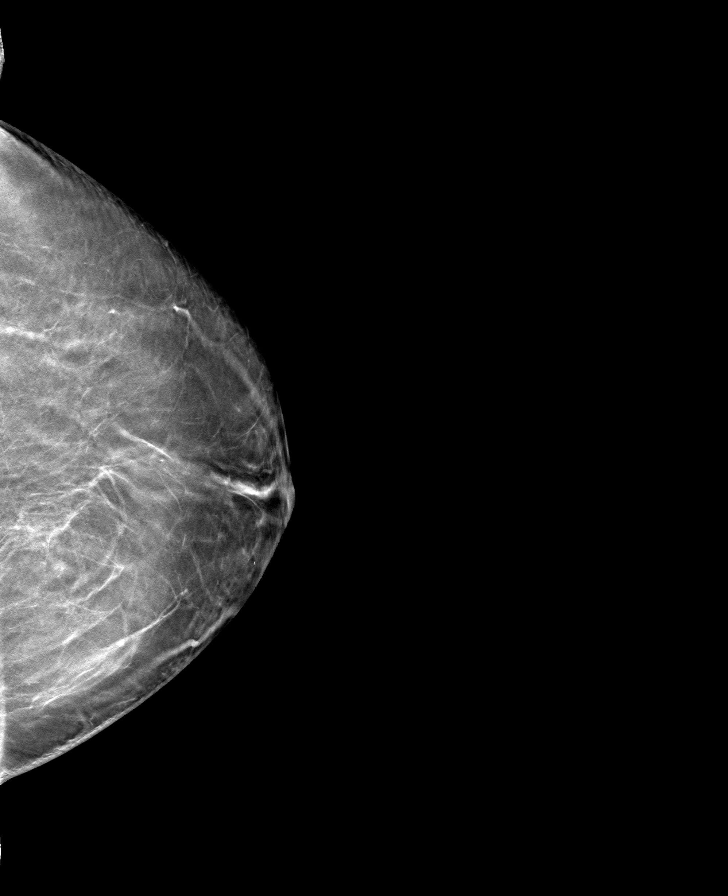

[R CC tomo · tomo slice 35/70.0]
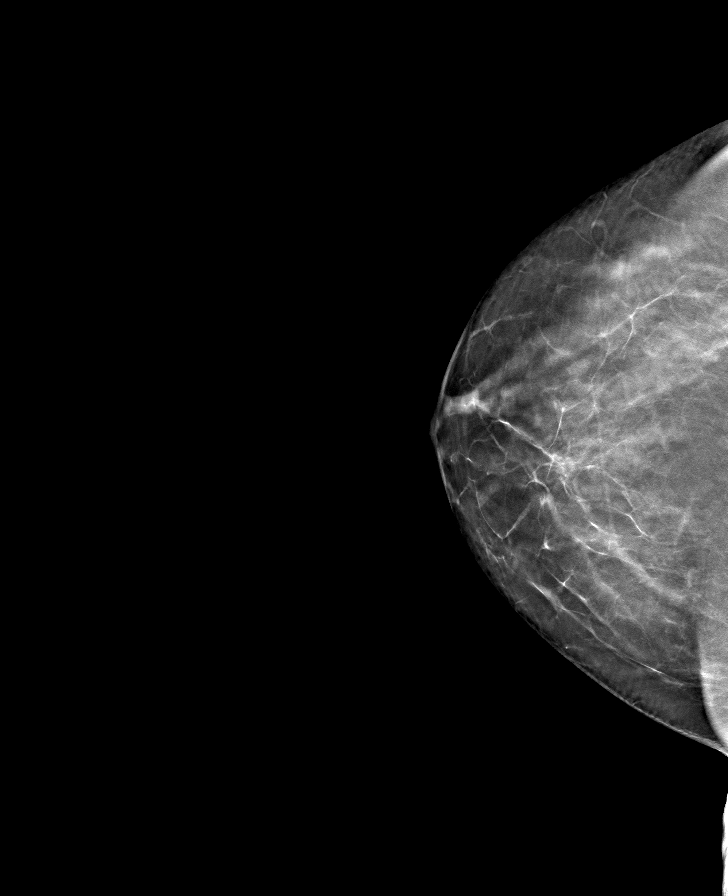

[8 of 24 positions shown; findings below may reference images not displayed]

ACR Breast Density Category b: There are scattered areas of
fibroglandular density.
FINDINGS: In the left breast, a possible asymmetry warrants further
evaluation. In the right breast, no findings suspicious for
malignancy.
IMPRESSION: Further evaluation is suggested for possible asymmetry in the left
breast.

RECOMMENDATION:
Diagnostic mammogram and possibly ultrasound of the left breast.
(Code:[id number])

The patient will be contacted regarding the findings, and additional
imaging will be scheduled.

BI-RADS CATEGORY  0: Incomplete. Need additional imaging evaluation
and/or prior mammograms for comparison.

## 2023-01-10 ENCOUNTER — Telehealth: Payer: Self-pay | Admitting: Hematology and Oncology

## 2023-01-10 NOTE — Telephone Encounter (Signed)
Spoke with patient confirming upcoming appointment  

## 2023-01-11 ENCOUNTER — Ambulatory Visit
Admission: RE | Admit: 2023-01-11 | Discharge: 2023-01-11 | Disposition: A | Discharge status: Home / Self Care | Payer: Medicare Other | Source: Ambulatory Visit | Attending: Adult Health | Admitting: Adult Health

## 2023-01-11 DIAGNOSIS — Z853 Personal history of malignant neoplasm of breast: Secondary | ICD-10-CM | POA: Diagnosis not present

## 2023-01-11 DIAGNOSIS — Z170 Estrogen receptor positive status [ER+]: Secondary | ICD-10-CM

## 2023-02-02 ENCOUNTER — Ambulatory Visit: Payer: Medicare Other | Admitting: Hematology and Oncology

## 2023-02-09 ENCOUNTER — Inpatient Hospital Stay: Payer: Medicare Other | Attending: Hematology and Oncology | Admitting: Hematology and Oncology

## 2023-02-09 VITALS — BP 173/59 | HR 76 | Temp 97.0°F | Resp 16 | Wt 153.4 lb

## 2023-02-09 DIAGNOSIS — Z800 Family history of malignant neoplasm of digestive organs: Secondary | ICD-10-CM | POA: Diagnosis not present

## 2023-02-09 DIAGNOSIS — Z79811 Long term (current) use of aromatase inhibitors: Secondary | ICD-10-CM | POA: Diagnosis not present

## 2023-02-09 DIAGNOSIS — Z923 Personal history of irradiation: Secondary | ICD-10-CM | POA: Insufficient documentation

## 2023-02-09 DIAGNOSIS — Z170 Estrogen receptor positive status [ER+]: Secondary | ICD-10-CM | POA: Diagnosis not present

## 2023-02-09 DIAGNOSIS — H10413 Chronic giant papillary conjunctivitis, bilateral: Secondary | ICD-10-CM | POA: Diagnosis not present

## 2023-02-09 DIAGNOSIS — C50312 Malignant neoplasm of lower-inner quadrant of left female breast: Secondary | ICD-10-CM | POA: Diagnosis not present

## 2023-02-09 DIAGNOSIS — M810 Age-related osteoporosis without current pathological fracture: Secondary | ICD-10-CM | POA: Diagnosis not present

## 2023-02-09 DIAGNOSIS — Z803 Family history of malignant neoplasm of breast: Secondary | ICD-10-CM | POA: Insufficient documentation

## 2023-02-09 DIAGNOSIS — H04123 Dry eye syndrome of bilateral lacrimal glands: Secondary | ICD-10-CM | POA: Diagnosis not present

## 2023-02-09 NOTE — Progress Notes (Signed)
BRIEF ONCOLOGIC HISTORY:  Oncology History  Malignant neoplasm of lower-inner quadrant of left breast in female, estrogen receptor positive ([facility])  01/09/2022 Mammogram   Screening mammogram detected possible asymmetry in the left breast.  Diagnostic mammogram and ultrasound showed persistent possibly irregular/spiculated subtle left breast asymmetry located medially.  There was no ultrasound correlate hence a stereotactic biopsy was recommended.   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  Per verbal report at breast [facility] today prognostics showed ER 95% strong PR 50% moderate, Ki-67 of 2% and HER2 negative   02/22/2022 Definitive Surgery   She had left breast lumpectomy which showed minute focus of invasive ductal carcinoma measuring less than 1 mm and a separate minute focus of DCIS once again like measuring less than 1 mm and clear margins of resection.   03/24/2022 Genetic Testing   Negative [facility] CancerNext-Expanded +RNAinsight.  Report date is 03/24/2022.   The CancerNext-Expanded gene panel offered by [facility] and includes sequencing, rearrangement, and RNA analysis for the following 77 genes: AIP, ALK, APC, ATM, AXIN2, BAP1, BARD1, BLM, BMPR1A, BRCA1, BRCA2, BRIP1, CDC73, CDH1, CDK4, CDKN1B, CDKN2A, CHEK2, CTNNA1, DICER1, FANCC, FH, FLCN, GALNT12, KIF1B, LZTR1, MAX, MEN1, MET, MLH1, MSH2, MSH3, MSH6, MUTYH, NBN, NF1, NF2, NTHL1, PALB2, PHOX2B, PMS2, POT1, PRKAR1A, PTCH1, PTEN, RAD51C, RAD51D, RB1, RECQL, RET, SDHA, SDHAF2, SDHB, SDHC, SDHD, SMAD4, SMARCA4, SMARCB1, SMARCE1, STK11, SUFU, TMEM127, TP53, TSC1, TSC2, VHL and XRCC2 (sequencing and deletion/duplication); EGFR, EGLN1, HOXB13, KIT, MITF, PDGFRA, POLD1, and POLE (sequencing only); EPCAM and GREM1 (deletion/duplication only).    03/30/2022 - 04/27/2022 Radiation Therapy   Site Technique Total Dose (Gy) Dose per Fx (Gy)  Completed Fx Beam Energies  Breast, Left: Breast_L 3D 42.56/42.56 2.66 16/16 10XFFF  Breast, Left: Breast_L_Bst 3D 8/8 2 4/4 6X, 10X     04/2022 -  Anti-estrogen oral therapy   Anastrozole     INTERVAL HISTORY:   She is taking anastrozole daily with good tolerance. She denies any significant issues.  She has h/o osteoporosis and is taking fosamax weekly, repeat bone density is 10/2022. Since her last visit, she has been doing quite well.  She has not noticed any side effects with anastrozole except for some mental fog she says.  She is exercising regularly.  She goes to the [facility], does some strength training and cardio.  Rest of the pertinent 10 point ROS reviewed and negative.  REVIEW OF SYSTEMS:  Review of Systems  Constitutional:  Negative for appetite change, chills, fatigue, fever and unexpected weight change.  HENT:   Negative for hearing loss, lump/mass and trouble swallowing.   Eyes:  Negative for eye problems and icterus.  Respiratory:  Negative for chest tightness, cough and shortness of breath.   Cardiovascular:  Negative for chest pain, leg swelling and palpitations.  Gastrointestinal:  Negative for abdominal distention, abdominal pain, constipation, diarrhea, nausea and vomiting.  Endocrine: Negative for hot flashes.  Genitourinary:  Negative for difficulty urinating.   Musculoskeletal:  Negative for arthralgias.  Skin:  Negative for itching and rash.  Neurological:  Negative for dizziness, extremity weakness, headaches and numbness.  Hematological:  Negative for adenopathy. Does not bruise/bleed easily.  Psychiatric/Behavioral:  Negative for depression. The patient is not nervous/anxious.   Breast: Denies any new nodularity, masses, tenderness, nipple changes, or nipple discharge.       PAST MEDICAL/SURGICAL HISTORY:  Past Medical History:  Diagnosis Date  Arthritis    right thumb   Asthma    Cancer ([facility]) 01/2022   left breast IDC   Family history of breast  cancer 02/09/2022   Family history of prostate cancer 02/09/2022   Hypertension    Pre-diabetes    Prediabetes    Prehypertension    Ulcerative proctitis ([facility])    on Mesalamine   Past Surgical History:  Procedure Laterality Date   ABDOMINAL HYSTERECTOMY     APPENDECTOMY  1985   BREAST BIOPSY Left 1985   BREAST BIOPSY Left 01/30/2022   BREAST EXCISIONAL BIOPSY Left 1985   BREAST LUMPECTOMY WITH RADIOACTIVE SEED LOCALIZATION Left 02/22/2022   Procedure: LEFT BREAST LUMPECTOMY WITH RADIOACTIVE SEED LOCALIZATION;  Surgeon: Manus Rudd, MD;  Location: [facility];  Service: General;  Laterality: Left;  [facility]   BREAST SURGERY  1995   CHOLECYSTECTOMY     COLON SURGERY  1984   COLONOSCOPY     GALLBLADDER SURGERY  1984   VESICOVAGINAL FISTULA CLOSURE W/ TAH  1985     ALLERGIES:  No Known Allergies   CURRENT MEDICATIONS:  Outpatient Encounter Medications as of 02/09/2023  Medication Sig   albuterol (VENTOLIN HFA) 108 (90 Base) MCG/ACT inhaler INHALE 2 PUFFS EVERY 4 HOURS AS DIRECTED - RESCUE INHALER   alendronate (FOSAMAX) 70 MG tablet Take 70 mg by mouth once a week.   amLODipine (NORVASC) 2.5 MG tablet Take 2.5 mg by mouth daily.   anastrozole (ARIMIDEX) 1 MG tablet Take 1 tablet (1 mg total) by mouth daily.   atorvastatin (LIPITOR) 10 MG tablet Take 10 mg by mouth daily.   calcium citrate-vitamin D (CITRACAL+D) 315-200 MG-UNIT per tablet Take 1 tablet by mouth daily.   cholecalciferol (VITAMIN D) 25 MCG (1000 UNIT) tablet 1 capsule   hydrochlorothiazide (MICROZIDE) 12.5 MG capsule Take 12.5 mg by mouth daily.   mesalamine (LIALDA) 1.2 g EC tablet    neomycin-polymyxin-hydrocortisone (CORTISPORIN) OTIC solution 4 (four) times daily.   No facility-administered encounter medications on file as of 02/09/2023.     ONCOLOGIC FAMILY HISTORY:  Family History  Problem Relation Age of Onset   Stomach cancer Father 39   Prostate cancer Father        dx after 68    Prostate cancer Brother        dx after 52   Prostate cancer Brother        dx after 4   Breast cancer Maternal Aunt        80s   Breast cancer Paternal Aunt        dx after 52    SOCIAL HISTORY:  Social History   Socioeconomic History   Marital status: Divorced    Spouse name: Not on file   Number of children: 1   Years of education: Not on file   Highest education level: Not on file  Occupational History   Occupation: Furniture conservator/restorer: [employer]  Tobacco Use   Smoking status: Never   Smokeless tobacco: Never  Vaping Use   Vaping status: Never Used  Substance and Sexual Activity   Alcohol use: No   Drug use: No   Sexual activity: Not Currently    Birth control/protection: Surgical    Comment: hyst  Other Topics Concern   Not on file  Social History Narrative   Not on file   Social Determinants of Health   Financial Resource Strain: Low Risk  (02/08/2022)   Overall Financial  Resource Strain (CARDIA)    Difficulty of Paying Living Expenses: Not very hard  Food Insecurity: No Food Insecurity (02/08/2022)   Hunger Vital Sign    Worried About Running Out of Food in the Last Year: Never true    Ran Out of Food in the Last Year: Never true  Transportation Needs: No Transportation Needs (02/08/2022)   PRAPARE - Administrator, Civil Service (Medical): No    Lack of Transportation (Non-Medical): No  Physical Activity: Not on file  Stress: Not on file  Social Connections: Not on file  Intimate Partner Violence: Not on file     OBSERVATIONS/OBJECTIVE:  BP (!) 173/59 (BP Location: Left Arm, Patient Position: Sitting) Comment: Nurse was notified  Pulse 76   Temp (!) 97 F (36.1 C) (Temporal)   Resp 16   Wt 153 lb 6.4 oz (69.6 kg)   SpO2 97%   BMI 27.17 kg/m  GENERAL: Patient is a well appearing female in no acute distress Breast: Bilateral breast inspected.  No palpable masses or regional adenopathy.  LABORATORY DATA:  None for this  visit.  DIAGNOSTIC IMAGING:  None for this visit.      ASSESSMENT AND PLAN:  Ms.. Becky Olson is a pleasant 72 y.o. female with Stage 1A left breast invasive ductal carcinoma, ER+/PR+/HER2-, diagnosed in July 2023, treated with lumpectomy, adjuvant radiation therapy, and anti-estrogen therapy with anastrozole beginning in October 2023  She is tolerating anastrozole extremely well.  She reports some mild mental fog which is tolerable.  She otherwise continues on Fosamax and her most recent bone density showed improvement in the T-score.  She will continue regular exercise as mentioned above.  No concerns on physical exam.  Last mammogram reviewed and no concerns for malignancy.  Next mammogram due in June 2025.  She will return to clinic in 6 months to see the breast surgeon.  Return to clinic with [place] in 1 year.  *Total Encounter Time as defined by the Centers for Medicare and Medicaid Services includes, in addition to the face-to-face time of a patient visit (documented in the note above) non-face-to-face time: obtaining and reviewing outside history, ordering and reviewing medications, tests or procedures, care coordination (communications with other health care professionals or caregivers) and documentation in the medical record.

## 2023-04-09 DIAGNOSIS — K5120 Ulcerative (chronic) proctitis without complications: Secondary | ICD-10-CM | POA: Diagnosis not present

## 2023-05-16 DIAGNOSIS — I10 Essential (primary) hypertension: Secondary | ICD-10-CM | POA: Diagnosis not present

## 2023-05-16 DIAGNOSIS — Z23 Encounter for immunization: Secondary | ICD-10-CM | POA: Diagnosis not present

## 2023-08-15 DIAGNOSIS — H25813 Combined forms of age-related cataract, bilateral: Secondary | ICD-10-CM | POA: Diagnosis not present

## 2023-08-15 DIAGNOSIS — H5203 Hypermetropia, bilateral: Secondary | ICD-10-CM | POA: Diagnosis not present

## 2023-08-15 DIAGNOSIS — H524 Presbyopia: Secondary | ICD-10-CM | POA: Diagnosis not present

## 2023-08-21 DIAGNOSIS — L821 Other seborrheic keratosis: Secondary | ICD-10-CM | POA: Diagnosis not present

## 2023-08-21 DIAGNOSIS — D1801 Hemangioma of skin and subcutaneous tissue: Secondary | ICD-10-CM | POA: Diagnosis not present

## 2023-08-21 DIAGNOSIS — D225 Melanocytic nevi of trunk: Secondary | ICD-10-CM | POA: Diagnosis not present

## 2023-08-21 DIAGNOSIS — L570 Actinic keratosis: Secondary | ICD-10-CM | POA: Diagnosis not present

## 2023-08-21 DIAGNOSIS — L814 Other melanin hyperpigmentation: Secondary | ICD-10-CM | POA: Diagnosis not present

## 2023-08-21 DIAGNOSIS — L853 Xerosis cutis: Secondary | ICD-10-CM | POA: Diagnosis not present

## 2023-08-28 ENCOUNTER — Other Ambulatory Visit: Payer: Self-pay | Admitting: Hematology and Oncology

## 2023-11-05 DIAGNOSIS — Z170 Estrogen receptor positive status [ER+]: Secondary | ICD-10-CM | POA: Diagnosis not present

## 2023-11-05 DIAGNOSIS — C50312 Malignant neoplasm of lower-inner quadrant of left female breast: Secondary | ICD-10-CM | POA: Diagnosis not present

## 2023-11-21 DIAGNOSIS — I10 Essential (primary) hypertension: Secondary | ICD-10-CM | POA: Diagnosis not present

## 2023-11-21 DIAGNOSIS — R7303 Prediabetes: Secondary | ICD-10-CM | POA: Diagnosis not present

## 2023-11-21 DIAGNOSIS — C50912 Malignant neoplasm of unspecified site of left female breast: Secondary | ICD-10-CM | POA: Diagnosis not present

## 2023-11-21 DIAGNOSIS — D8481 Immunodeficiency due to conditions classified elsewhere: Secondary | ICD-10-CM | POA: Diagnosis not present

## 2023-11-21 DIAGNOSIS — K5120 Ulcerative (chronic) proctitis without complications: Secondary | ICD-10-CM | POA: Diagnosis not present

## 2023-11-21 DIAGNOSIS — Z23 Encounter for immunization: Secondary | ICD-10-CM | POA: Diagnosis not present

## 2023-11-21 DIAGNOSIS — M810 Age-related osteoporosis without current pathological fracture: Secondary | ICD-10-CM | POA: Diagnosis not present

## 2023-11-21 DIAGNOSIS — J4520 Mild intermittent asthma, uncomplicated: Secondary | ICD-10-CM | POA: Diagnosis not present

## 2023-11-21 DIAGNOSIS — E7800 Pure hypercholesterolemia, unspecified: Secondary | ICD-10-CM | POA: Diagnosis not present

## 2023-11-21 DIAGNOSIS — C50312 Malignant neoplasm of lower-inner quadrant of left female breast: Secondary | ICD-10-CM | POA: Diagnosis not present

## 2023-11-21 DIAGNOSIS — Z0000 Encounter for general adult medical examination without abnormal findings: Secondary | ICD-10-CM | POA: Diagnosis not present

## 2023-12-04 ENCOUNTER — Encounter: Payer: Self-pay | Admitting: Internal Medicine

## 2023-12-04 ENCOUNTER — Other Ambulatory Visit: Payer: Self-pay | Admitting: Internal Medicine

## 2023-12-04 DIAGNOSIS — Z9889 Other specified postprocedural states: Secondary | ICD-10-CM

## 2024-01-16 ENCOUNTER — Ambulatory Visit
Admission: RE | Admit: 2024-01-16 | Discharge: 2024-01-16 | Disposition: A | Discharge status: Home / Self Care | Source: Ambulatory Visit | Attending: Internal Medicine | Admitting: Internal Medicine

## 2024-01-16 DIAGNOSIS — Z853 Personal history of malignant neoplasm of breast: Secondary | ICD-10-CM | POA: Diagnosis not present

## 2024-01-16 DIAGNOSIS — Z08 Encounter for follow-up examination after completed treatment for malignant neoplasm: Secondary | ICD-10-CM | POA: Diagnosis not present

## 2024-01-16 DIAGNOSIS — Z9889 Other specified postprocedural states: Secondary | ICD-10-CM

## 2024-01-17 DIAGNOSIS — J069 Acute upper respiratory infection, unspecified: Secondary | ICD-10-CM | POA: Diagnosis not present

## 2024-01-17 DIAGNOSIS — J4521 Mild intermittent asthma with (acute) exacerbation: Secondary | ICD-10-CM | POA: Diagnosis not present

## 2024-02-04 ENCOUNTER — Telehealth: Payer: Self-pay | Admitting: Hematology and Oncology

## 2024-02-04 NOTE — Telephone Encounter (Signed)
 Called to reschedule patient appointment to due provider pal  request. I talked  to patient and they are aware of the changes that was made to the upcoming appointment

## 2024-02-11 ENCOUNTER — Ambulatory Visit: Payer: Medicare Other | Admitting: Hematology and Oncology

## 2024-02-25 DIAGNOSIS — K5289 Other specified noninfective gastroenteritis and colitis: Secondary | ICD-10-CM | POA: Diagnosis not present

## 2024-02-25 DIAGNOSIS — K635 Polyp of colon: Secondary | ICD-10-CM | POA: Diagnosis not present

## 2024-02-25 DIAGNOSIS — Z860101 Personal history of adenomatous and serrated colon polyps: Secondary | ICD-10-CM | POA: Diagnosis not present

## 2024-02-25 DIAGNOSIS — K648 Other hemorrhoids: Secondary | ICD-10-CM | POA: Diagnosis not present

## 2024-02-25 DIAGNOSIS — Z09 Encounter for follow-up examination after completed treatment for conditions other than malignant neoplasm: Secondary | ICD-10-CM | POA: Diagnosis not present

## 2024-02-27 ENCOUNTER — Telehealth: Payer: Self-pay

## 2024-02-27 NOTE — Telephone Encounter (Signed)
 Pt verbally confirmed appt for 7/31

## 2024-02-28 ENCOUNTER — Inpatient Hospital Stay: Attending: Hematology and Oncology | Admitting: Hematology and Oncology

## 2024-02-28 DIAGNOSIS — Z79811 Long term (current) use of aromatase inhibitors: Secondary | ICD-10-CM | POA: Diagnosis not present

## 2024-02-28 DIAGNOSIS — M810 Age-related osteoporosis without current pathological fracture: Secondary | ICD-10-CM | POA: Insufficient documentation

## 2024-02-28 DIAGNOSIS — Z923 Personal history of irradiation: Secondary | ICD-10-CM | POA: Insufficient documentation

## 2024-02-28 DIAGNOSIS — R0789 Other chest pain: Secondary | ICD-10-CM | POA: Diagnosis not present

## 2024-02-28 DIAGNOSIS — R0781 Pleurodynia: Secondary | ICD-10-CM | POA: Diagnosis not present

## 2024-02-28 DIAGNOSIS — I10 Essential (primary) hypertension: Secondary | ICD-10-CM | POA: Diagnosis not present

## 2024-02-28 DIAGNOSIS — J40 Bronchitis, not specified as acute or chronic: Secondary | ICD-10-CM | POA: Diagnosis not present

## 2024-02-28 DIAGNOSIS — Z7983 Long term (current) use of bisphosphonates: Secondary | ICD-10-CM | POA: Diagnosis not present

## 2024-02-28 DIAGNOSIS — Z170 Estrogen receptor positive status [ER+]: Secondary | ICD-10-CM | POA: Insufficient documentation

## 2024-02-28 DIAGNOSIS — C50312 Malignant neoplasm of lower-inner quadrant of left female breast: Secondary | ICD-10-CM | POA: Diagnosis not present

## 2024-02-28 DIAGNOSIS — Z803 Family history of malignant neoplasm of breast: Secondary | ICD-10-CM | POA: Insufficient documentation

## 2024-02-28 DIAGNOSIS — Z800 Family history of malignant neoplasm of digestive organs: Secondary | ICD-10-CM | POA: Insufficient documentation

## 2024-02-28 NOTE — Progress Notes (Signed)
 BRIEF ONCOLOGIC HISTORY:  Oncology History  Malignant neoplasm of lower-inner quadrant of left breast in female, estrogen receptor positive (HCC)  01/09/2022 Mammogram   Screening mammogram detected possible asymmetry in the left breast.  Diagnostic mammogram and ultrasound showed persistent possibly irregular/spiculated subtle left breast asymmetry located medially.  There was no ultrasound correlate hence a stereotactic biopsy was recommended.   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  Per verbal report at [facility] today prognostics showed ER 95% strong PR 50% moderate, Ki-67 of 2% and HER2 negative   02/22/2022 Definitive Surgery   She had left breast lumpectomy which showed minute focus of invasive ductal carcinoma measuring less than 1 mm and a separate minute focus of DCIS once again like measuring less than 1 mm and clear margins of resection.   03/24/2022 Genetic Testing   Negative [facility] CancerNext-Expanded +RNAinsight.  Report date is 03/24/2022.   The CancerNext-Expanded gene panel offered by [facility] and includes sequencing, rearrangement, and RNA analysis for the following 77 genes: AIP, ALK, APC, ATM, AXIN2, BAP1, BARD1, BLM, BMPR1A, BRCA1, BRCA2, BRIP1, CDC73, CDH1, CDK4, CDKN1B, CDKN2A, CHEK2, CTNNA1, DICER1, FANCC, FH, FLCN, GALNT12, KIF1B, LZTR1, MAX, MEN1, MET, MLH1, MSH2, MSH3, MSH6, MUTYH, NBN, NF1, NF2, NTHL1, PALB2, PHOX2B, PMS2, POT1, PRKAR1A, PTCH1, PTEN, RAD51C, RAD51D, RB1, RECQL, RET, SDHA, SDHAF2, SDHB, SDHC, SDHD, SMAD4, SMARCA4, SMARCB1, SMARCE1, STK11, SUFU, TMEM127, TP53, TSC1, TSC2, VHL and XRCC2 (sequencing and deletion/duplication); EGFR, EGLN1, HOXB13, KIT, MITF, PDGFRA, POLD1, and POLE (sequencing only); EPCAM and GREM1 (deletion/duplication only).    03/30/2022 - 04/27/2022 Radiation Therapy   Site Technique Total Dose (Gy) Dose per Fx (Gy)  Completed Fx Beam Energies  Breast, Left: Breast_L 3D 42.56/42.56 2.66 16/16 10XFFF  Breast, Left: Breast_L_Bst 3D 8/8 2 4/4 6X, 10X     04/2022 -  Anti-estrogen oral therapy   Anastrozole      INTERVAL HISTORY:   She is taking anastrozole  daily with good tolerance.   She wasn't feeling well since early June. She had a bad virus and almost took about 30 days of recover. She fell about a week ago, slipped on back porch of a daughter. She had a colonoscopy about a week ago, results were good. Rest of the pertinent 10 point ROS reviewed and negative.        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   Past Surgical History:  Procedure Laterality Date   ABDOMINAL HYSTERECTOMY     APPENDECTOMY  1985   BREAST BIOPSY Left 1985   BREAST BIOPSY Left 01/30/2022   BREAST EXCISIONAL BIOPSY Left 1985   BREAST LUMPECTOMY WITH RADIOACTIVE SEED LOCALIZATION Left 02/22/2022   Procedure: LEFT BREAST LUMPECTOMY WITH RADIOACTIVE SEED LOCALIZATION;  Surgeon: Belinda Cough, MD;  Location: [facility];  Service: General;  Laterality: Left;  LMA   BREAST SURGERY  1995   CHOLECYSTECTOMY     COLON SURGERY  1984   COLONOSCOPY     GALLBLADDER SURGERY  1984   VESICOVAGINAL FISTULA CLOSURE  W/ TAH  1985     ALLERGIES:  No Known Allergies   CURRENT MEDICATIONS:  Outpatient Encounter Medications as of 02/28/2024  Medication Sig   albuterol  (VENTOLIN  HFA) 108 (90 Base) MCG/ACT inhaler INHALE 2 PUFFS EVERY 4 HOURS AS DIRECTED - RESCUE INHALER   alendronate (FOSAMAX) 70 MG tablet Take 70 mg by mouth once a week.   amLODipine (NORVASC) 2.5 MG tablet Take 2.5 mg by mouth  daily.   anastrozole  (ARIMIDEX ) 1 MG tablet TAKE 1 TABLET BY MOUTH DAILY   atorvastatin (LIPITOR) 10 MG tablet Take 10 mg by mouth daily.   calcium citrate-vitamin D (CITRACAL+D) 315-200 MG-UNIT per tablet Take 1 tablet by mouth daily.   cholecalciferol (VITAMIN D) 25 MCG (1000 UNIT) tablet 1 capsule   hydrochlorothiazide (MICROZIDE) 12.5 MG capsule Take 12.5 mg by mouth daily.   mesalamine (LIALDA) 1.2 g EC tablet    neomycin-polymyxin-hydrocortisone (CORTISPORIN) OTIC solution 4 (four) times daily.   No facility-administered encounter medications on file as of 02/28/2024.     ONCOLOGIC FAMILY HISTORY:  Family History  Problem Relation Age of Onset   Stomach cancer Father 34   Prostate cancer Father        dx after 36   Prostate cancer Brother        dx after 46   Prostate cancer Brother        dx after 21   Breast cancer Maternal Aunt        21s   Breast cancer Paternal Aunt        dx after 81    SOCIAL HISTORY:  Social History   Socioeconomic History   Marital status: Divorced    Spouse name: Not on file   Number of children: 1   Years of education: Not on file   Highest education level: Not on file  Occupational History   Occupation: Furniture conservator/restorer: [employer]  Tobacco Use   Smoking status: Never   Smokeless tobacco: Never  Vaping Use   Vaping status: Never Used  Substance and Sexual Activity   Alcohol use: No   Drug use: No   Sexual activity: Not Currently    Birth control/protection: Surgical    Comment: hyst  Other Topics Concern   Not on file  Social History Narrative   Not on file   Social Drivers of Health   Financial Resource Strain: Low Risk  (02/08/2022)   Overall Financial Resource Strain (CARDIA)    Difficulty of Paying Living Expenses: Not very hard  Food Insecurity: No Food Insecurity (02/08/2022)   Hunger Vital Sign    Worried About Running Out of Food in the Last Year: Never true    Ran Out of Food in the Last Year: Never  true  Transportation Needs: No Transportation Needs (02/08/2022)   PRAPARE - Administrator, Civil Service (Medical): No    Lack of Transportation (Non-Medical): No  Physical Activity: Not on file  Stress: Not on file  Social Connections: Not on file  Intimate Partner Violence: Not on file     OBSERVATIONS/OBJECTIVE:  There were no vitals taken for this visit. GENERAL: Patient is a well appearing female in no acute distress Breast: Bilateral breast inspected.  No palpable masses or regional adenopathy.  LABORATORY DATA:  None for this visit.  DIAGNOSTIC IMAGING:  None for this visit.      ASSESSMENT AND PLAN:  Ms.. Ratto is a pleasant 73 y.o. female with  Stage 1A left breast invasive ductal carcinoma, ER+/PR+/HER2-, diagnosed in July 2023, treated with lumpectomy, adjuvant radiation therapy, and anti-estrogen therapy with anastrozole  beginning in October 2023  She is tolerating anastrozole  extremely well.    Mammogram June 2025, neg for malignancy No concern for recurrence based on ROS and PE. She will continue anastrozole  and RTC in 1 yr.  Osteoporosis on fosamax Continue weekly fosamax Bone density every 2 yrs.  Recent fall, Dr Cleotilde ordered some x rays No palpable tenderness or crepitus or hematoma noted today She will follow up with Dr Cleotilde.  Return to clinic with us  in 1 year.  Time spent: 30 min  *Total Encounter Time as defined by the Centers for Medicare and Medicaid Services includes, in addition to the face-to-face time of a patient visit (documented in the note above) non-face-to-face time: obtaining and reviewing outside history, ordering and reviewing medications, tests or procedures, care coordination (communications with other health care professionals or caregivers) and documentation in the medical record.

## 2024-05-02 ENCOUNTER — Other Ambulatory Visit: Payer: Self-pay | Admitting: Hematology and Oncology

## 2025-02-27 ENCOUNTER — Ambulatory Visit: Admitting: Hematology and Oncology
